# Patient Record
Sex: Male | Born: 1950 | Race: White | Hispanic: No | Marital: Married | Smoking: Former smoker
Health system: Southern US, Community
[De-identification: ages and names within clinical notes are randomized; demographics above are authoritative.]

## PROBLEM LIST (undated history)

## (undated) DIAGNOSIS — I10 Essential (primary) hypertension: Secondary | ICD-10-CM

## (undated) DIAGNOSIS — Z954 Presence of other heart-valve replacement: Secondary | ICD-10-CM

## (undated) DIAGNOSIS — I4891 Unspecified atrial fibrillation: Secondary | ICD-10-CM

## (undated) DIAGNOSIS — E7800 Pure hypercholesterolemia, unspecified: Secondary | ICD-10-CM

## (undated) DIAGNOSIS — M459 Ankylosing spondylitis of unspecified sites in spine: Secondary | ICD-10-CM

## (undated) DIAGNOSIS — J4489 Other specified chronic obstructive pulmonary disease: Secondary | ICD-10-CM

## (undated) DIAGNOSIS — I718 Aortic aneurysm of unspecified site, ruptured: Secondary | ICD-10-CM

## (undated) DIAGNOSIS — N289 Disorder of kidney and ureter, unspecified: Secondary | ICD-10-CM

## (undated) DIAGNOSIS — N029 Recurrent and persistent hematuria with unspecified morphologic changes: Secondary | ICD-10-CM

## (undated) DIAGNOSIS — I712 Thoracic aortic aneurysm, without rupture, unspecified: Secondary | ICD-10-CM

## (undated) DIAGNOSIS — N400 Enlarged prostate without lower urinary tract symptoms: Secondary | ICD-10-CM

## (undated) DIAGNOSIS — K589 Irritable bowel syndrome without diarrhea: Secondary | ICD-10-CM

## (undated) DIAGNOSIS — J449 Chronic obstructive pulmonary disease, unspecified: Secondary | ICD-10-CM

## (undated) HISTORY — DX: Benign prostatic hyperplasia without lower urinary tract symptoms: N40.0

## (undated) HISTORY — DX: Disorder of kidney and ureter, unspecified: N28.9

## (undated) HISTORY — DX: Essential (primary) hypertension: I10

## (undated) HISTORY — DX: Other specified chronic obstructive pulmonary disease: J44.89

## (undated) HISTORY — DX: Chronic obstructive pulmonary disease, unspecified: J44.9

## (undated) HISTORY — DX: Aortic aneurysm of unspecified site, ruptured: I71.8

## (undated) HISTORY — DX: Unspecified atrial fibrillation: I48.91

## (undated) HISTORY — DX: Presence of other heart-valve replacement: Z95.4

## (undated) HISTORY — DX: Pure hypercholesterolemia, unspecified: E78.00

## (undated) HISTORY — DX: Irritable bowel syndrome, unspecified: K58.9

## (undated) HISTORY — DX: Thoracic aortic aneurysm, without rupture, unspecified: I71.20

## (undated) HISTORY — DX: Recurrent and persistent hematuria with unspecified morphologic changes: N02.9

## (undated) HISTORY — DX: Ankylosing spondylitis of unspecified sites in spine: M45.9

## (undated) HISTORY — DX: Thoracic aortic aneurysm, without rupture: I71.2

---
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
## 1998-01-16 ENCOUNTER — Encounter: Payer: Self-pay | Admitting: Family Medicine

## 1998-01-16 ENCOUNTER — Ambulatory Visit (HOSPITAL_COMMUNITY): Admission: RE | Admit: 1998-01-16 | Discharge: 1998-01-16 | Payer: Self-pay | Admitting: Family Medicine

## 1998-03-04 DIAGNOSIS — I129 Hypertensive chronic kidney disease with stage 1 through stage 4 chronic kidney disease, or unspecified chronic kidney disease: Secondary | ICD-10-CM | POA: Insufficient documentation

## 1998-04-21 ENCOUNTER — Encounter: Payer: Self-pay | Admitting: Family Medicine

## 1998-04-21 ENCOUNTER — Ambulatory Visit (HOSPITAL_COMMUNITY): Admission: RE | Admit: 1998-04-21 | Discharge: 1998-04-21 | Payer: Self-pay | Admitting: Family Medicine

## 1999-10-16 ENCOUNTER — Ambulatory Visit (HOSPITAL_COMMUNITY): Admission: RE | Admit: 1999-10-16 | Discharge: 1999-10-16 | Payer: Self-pay | Admitting: Gastroenterology

## 2000-05-29 ENCOUNTER — Encounter: Payer: Self-pay | Admitting: Rheumatology

## 2000-05-29 ENCOUNTER — Encounter: Admission: RE | Admit: 2000-05-29 | Discharge: 2000-05-29 | Payer: Self-pay | Admitting: Rheumatology

## 2001-01-13 ENCOUNTER — Encounter: Admission: RE | Admit: 2001-01-13 | Discharge: 2001-01-13 | Payer: Self-pay | Admitting: Rheumatology

## 2001-01-13 ENCOUNTER — Encounter: Payer: Self-pay | Admitting: Rheumatology

## 2001-01-26 ENCOUNTER — Encounter: Payer: Self-pay | Admitting: Rheumatology

## 2001-01-26 ENCOUNTER — Encounter: Admission: RE | Admit: 2001-01-26 | Discharge: 2001-01-26 | Payer: Self-pay | Admitting: Rheumatology

## 2002-01-04 ENCOUNTER — Encounter (INDEPENDENT_AMBULATORY_CARE_PROVIDER_SITE_OTHER): Payer: Self-pay | Admitting: *Deleted

## 2002-01-04 ENCOUNTER — Ambulatory Visit (HOSPITAL_COMMUNITY): Admission: RE | Admit: 2002-01-04 | Discharge: 2002-01-04 | Payer: Self-pay | Admitting: Gastroenterology

## 2002-10-20 ENCOUNTER — Ambulatory Visit (HOSPITAL_COMMUNITY): Admission: RE | Admit: 2002-10-20 | Discharge: 2002-10-20 | Payer: Self-pay | Admitting: *Deleted

## 2002-10-20 ENCOUNTER — Encounter: Payer: Self-pay | Admitting: *Deleted

## 2003-02-03 ENCOUNTER — Ambulatory Visit (HOSPITAL_COMMUNITY): Admission: RE | Admit: 2003-02-03 | Discharge: 2003-02-03 | Payer: Self-pay | Admitting: Family Medicine

## 2003-04-11 ENCOUNTER — Ambulatory Visit (HOSPITAL_COMMUNITY): Admission: RE | Admit: 2003-04-11 | Discharge: 2003-04-11 | Payer: Self-pay | Admitting: Family Medicine

## 2003-11-21 ENCOUNTER — Ambulatory Visit: Admission: RE | Admit: 2003-11-21 | Discharge: 2003-11-21 | Payer: Self-pay | Admitting: Family Medicine

## 2004-10-06 ENCOUNTER — Emergency Department (HOSPITAL_COMMUNITY): Admission: EM | Admit: 2004-10-06 | Discharge: 2004-10-06 | Payer: Self-pay | Admitting: Emergency Medicine

## 2005-01-03 ENCOUNTER — Encounter: Admission: RE | Admit: 2005-01-03 | Discharge: 2005-01-03 | Payer: Self-pay | Admitting: Family Medicine

## 2005-02-08 ENCOUNTER — Encounter: Admission: RE | Admit: 2005-02-08 | Discharge: 2005-02-08 | Payer: Self-pay | Admitting: Family Medicine

## 2005-08-12 ENCOUNTER — Emergency Department (HOSPITAL_COMMUNITY): Admission: EM | Admit: 2005-08-12 | Discharge: 2005-08-12 | Payer: Self-pay | Admitting: Emergency Medicine

## 2005-08-19 ENCOUNTER — Encounter (HOSPITAL_COMMUNITY): Admission: RE | Admit: 2005-08-19 | Discharge: 2005-11-14 | Payer: Self-pay | Admitting: Emergency Medicine

## 2006-02-14 ENCOUNTER — Encounter: Admission: RE | Admit: 2006-02-14 | Discharge: 2006-02-14 | Payer: Self-pay | Admitting: Surgery

## 2007-03-13 ENCOUNTER — Encounter: Admission: RE | Admit: 2007-03-13 | Discharge: 2007-03-13 | Payer: Self-pay | Admitting: Surgery

## 2007-03-17 ENCOUNTER — Ambulatory Visit: Payer: Self-pay | Admitting: Surgery

## 2008-03-07 ENCOUNTER — Encounter: Admission: RE | Admit: 2008-03-07 | Discharge: 2008-03-07 | Payer: Self-pay | Admitting: Surgery

## 2008-03-08 ENCOUNTER — Ambulatory Visit: Payer: Self-pay | Admitting: Surgery

## 2009-03-04 DIAGNOSIS — Z954 Presence of other heart-valve replacement: Secondary | ICD-10-CM

## 2009-03-04 HISTORY — DX: Presence of other heart-valve replacement: Z95.4

## 2009-03-13 ENCOUNTER — Encounter: Admission: RE | Admit: 2009-03-13 | Discharge: 2009-03-13 | Payer: Self-pay | Admitting: Surgery

## 2009-03-14 ENCOUNTER — Ambulatory Visit: Payer: Self-pay | Admitting: Surgery

## 2009-04-25 ENCOUNTER — Ambulatory Visit: Payer: Self-pay | Admitting: Surgery

## 2009-09-21 HISTORY — PX: AORTIC VALVE REPLACEMENT: SHX41

## 2009-10-05 ENCOUNTER — Encounter (HOSPITAL_COMMUNITY): Admission: RE | Admit: 2009-10-05 | Discharge: 2009-12-01 | Payer: Self-pay | Admitting: Interventional Cardiology

## 2009-12-02 ENCOUNTER — Encounter (HOSPITAL_COMMUNITY): Admission: RE | Admit: 2009-12-02 | Discharge: 2010-01-15 | Payer: Self-pay | Admitting: Interventional Cardiology

## 2010-03-24 ENCOUNTER — Encounter: Payer: Self-pay | Admitting: Family Medicine

## 2010-07-17 NOTE — Assessment & Plan Note (Signed)
OFFICE VISIT   [name], [name]  DOB:  [birth year]                                        March 08, 2008  CHART #:  [id number]   The patient returns today for followup of his bicuspid aortic valve and  ascending aortic aneurysm.  I last saw him on March 17, 2007, at which  time his aortic aneurysm measured 4.4 x 4.4 cm.  He does have a bicuspid  aortic valve and an echocardiogram in June 2009 showed an aortic valve  area of 1.5-1.8 cm2 with no aortic insufficiency.  Over the past year,  he said he has been feeling fairly well overall, although his arthritis  is worsened some.  He continues to be physically active.  He has had no  shortness of breath with exertion or at rest.  He has had no dizziness  or syncope.  He has had no chest pain.   PHYSICAL EXAMINATION:  VITAL SIGNS:  Today, his blood pressure is  131/93, pulse 77 and regular, and respiratory rate is 18 and unlabored.  Oxygen saturation on room air is 97%.  He did not take his blood  pressure medication this morning.  GENERAL:  He looks well.  CARDIAC:  A regular rate and rhythm with a grade 3/6 systolic murmur  loudest over the aorta.  There is no diastolic murmur.  LUNGS:  Clear.   MEDICATIONS:  Lisinopril 5 mg daily, Lopressor 25 mg daily, Zocor daily,  and Enbrel 50 mg weekly.   A MR angiogram performed on March 07, 2008, showed no significant  change in the size of his ascending aortic aneurysm with a dimension of  4.3 x 4.5 cm.   IMPRESSION:  The patient has a 4.3 x 4.5-cm fusiform ascending aortic  aneurysm that is unchanged in size over the past year as well as a  bicuspid aortic valve with a history of mild-to-moderate aortic  stenosis.  He is doing well clinically.  I recommended continuing to  follow his aneurysm with a repeat MR angiogram in 1 year.  He has seen  Dr. Katrinka [name] in June and will have another echocardiogram done at that  time.  He currently has no clinical symptoms of  aortic stenosis and I  discussed that with him and asked him to contact Dr. Katrinka [name] if he  develops any other symptoms.  I will plan to see him back in 1 year.   Ryan [name], M.D.  Electronically Signed   BB/MEDQ  D:  03/08/2008  T:  03/08/2008  Job:  [id number]   cc:   Ryan [name], M.D.

## 2010-07-17 NOTE — Assessment & Plan Note (Signed)
OFFICE VISIT   Spayd, Kodiak E  DOB:  1950-10-18                                        March 14, 2009  CHART #:  [id number]   The patient returns today for followup of his bicuspid aortic valve  disease and ascending aortic aneurysm.  I last saw him 1 year ago on  March 08, 2008, at which time his MR angiogram showed that the maximum  diameter of his ascending aortic aneurysm was 4.3 x 4.5 cm.  This had  not changed in size.  The patient had moderate aortic stenosis at that  time.  He has been followed by Dr. Katrinka Blazing and said that his aortic  stenosis has been worsening.  He said that he was told by Dr. Katrinka Blazing that  his aortic valve area was 0.7 cm2 and it was probably time to proceed  with surgical repair.  He denies any chest pain or pressure.  He has had  some exertional dyspnea and fatigue.  He has not had any dizziness or  syncope.   On physical examination, his blood pressure is 138/93, pulse is 78 and  regular, and respiratory rate is 18 unlabored.  Oxygen saturation on  room air is 97%.  He looks well.  He has gained some weight since last  time I saw him.  Cardiac exam shows a regular rate and rhythm with a  grade 3/6 systolic murmur of aortic stenosis.  There is no diastolic  murmur.  His lungs are clear.  There is no peripheral edema.   An MR angiogram done March 13, 2009 shows there has been no  significant change in the size of his aneurysmal ascending aorta with a  maximum diameter measured at 4.3 cm.  There is taping to 3.4 cm in the  proximal arch and 2.3 cm in the distal arch.   IMPRESSION:  The patient has a stable 4.3-4.5 cm ascending and proximal  arch aneurysm with bicuspid aortic valve and worsening aortic stenosis.  I agree with Dr. Katrinka Blazing it is probably time to proceed with surgical  repair.  The patient said that he and his wife have made an appointment  at the [facility] for evaluation and probable surgery.  He has  requested our office records and is forwarding all that information to  the surgeon at the [facility].  I told him I would be happy to see  him again if the need arises to answer any questions.  Since he is  planning on  transferring his care to the [facility], I told him that he did  not need to return to see me unless some questions arise that I may help  him with.   Evelene Croon, M.D.  Electronically Signed   BB/MEDQ  D:  03/14/2009  T:  03/14/2009  Job:  [id number]   cc:   Lyn Records, M.D.

## 2010-07-17 NOTE — Assessment & Plan Note (Signed)
OFFICE VISIT   Castellana, Deryk E  DOB:  [birth year]                                        March 17, 2007  CHART #:  [id number]   Mr. Urwin returns today for follow up of his bicuspid aortic valve and  ascending aortic aneurysm.  I last saw him on 02/18/2006.  An MR  angiogram at that time had shown the maximum diameter of his ascending  aortic aneurysm was 4.1 cm.  Over the past two years he has been doing  well and has remained quite active despite his worsening ankylosing  spondylitis.  He said that he spent 6 weeks in [place] this year hiking  and camping and had no problem doing that.  He denies any chest pain or  pressure.  He has had no pains in his back.  He has had no shortness of  breath.  His energy level has been good.   PHYSICAL EXAMINATION:  His blood pressure is 113/78 and his pulse is 74  and regular.  Respiratory rate is 18 and unlabored.  Oxygen saturation  on room air is 97%.  He looks well.  Cardiac exam shows a regular rate  and rhythm with a grade 2/6 systolic murmur, loudest over the aorta.  His lungs are clear.   Follow up MR angiogram done 03/13/2007 shows the maximum diameter of his  ascending aortic aneurysm is slightly enlarged to 4.4 x 4.4 cm from the  previous 4.1 x 4.1 cm.  There is no other significant change to his  angiogram.   CURRENT MEDICATIONS:  Zocor 20 mg daily, Enbrel injections once a week  and Lopressor 25 mg once daily.  He had been on lisinopril 10 mg per day  but this was recently switched to Lopressor by his primary physician.   IMPRESSION:  Overall, Mr. Cantara is doing well.  The maximum diameter of  his ascending aortic aneurysm has increased by 3 mm over the past 2  years to 4.4 x 4.4 cm.  I recommended continuing to follow this for now  and repeating his MR angiogram in one year.  If there is progressive  enlargement over that period of time, then we would have to consider  proceeding  with surgical repair.  He  does have an appointment to see Dr. Katrinka Blazing  within the next 2 months for follow up of his bicuspid aortic valve.  He  appears to have good blood pressure control.   Evelene Croon, M.D.  Electronically Signed   BB/MEDQ  D:  03/17/2007  T:  03/17/2007  Job:  [id number]   cc:   Lyn Records, M.D.  Carola J. Gerri Spore, M.D.

## 2010-07-17 NOTE — Assessment & Plan Note (Signed)
OFFICE VISIT   Ryan [name], Ryan [name]  DOB:  08-28-1950                                        April 26, 2009  CHART #:  [id number]   The patient returned to my office today for further discussion of his  bicuspid aortic valve disease and ascending aortic aneurysm.  He has a  4.3-cm ascending aortic aneurysm with a bicuspid aortic valve that we  have been following.  His aortic valve stenosis has worsened and  by his  report, echocardiogram shows that the valve area is 0.7 cm2.  The  echocardiogram was done in Dr. Michaelle Copas office, so I was not able to  review that.  I have not received an official report of that  echocardiogram.  He was told by Dr. Katrinka Blazing that he needed to have his  aortic valve replaced and when I saw him on March 14, 2009, I  discussed my recommendation for replacing his ascending aortic aneurysm  along with replacement of the aortic valve using a valve conduit.  He  said that he have made arrangements to be evaluated at the [facility] and to have a surgery there since his wife and other family  members had been pushing him in that direction.  I told him that I would  be happy to see him back if he had any further questions.  He returns  today and said that he has been in contact with [facility], but  has not been receiving as much information as he would like.  He was  told that he would need to go out from the [facility] and have a  cardiac catheterization and further studies before being evaluated by a  surgeon.  He was also concerned because he was told by the clinical  nurse that he would probably have just an aortic valve replacement and  not replacement of his ascending aortic aneurysm since it was not large  enough yet.  This concerned him and therefore, he returned today to  discuss it further with me.  I reviewed his MR angiogram findings again  with him and my recommendation that if he has surgery that he have  replacement of his valve and ascending aortic aneurysm.  This would  probably require replacement up to the proximal aortic arch using deep  hypothermic circulatory arrest.  I discussed the options for using a  mechanical valve conduit or a tissue valve conduit with a pericardial  prosthesis.  I discussed the benefits and risks of both.  He said that  he would probably rather have a tissue valve since he is not thrilled  about being on Coumadin and is also planning on traveling extensively,  and thinks he will have problems with Coumadin compliance and followup.  He also has a history of significant ankylosing spondylitis that has  been worsening, and I think would be at increased risk for use of  Coumadin.  I discussed the surgery in detail with him and answered all  of his questions.  He said that he is probably planning on going out to  the [facility] to be evaluated and that will make a decision about  where he would like to have surgery and will contact me if the need  arises.   Evelene Croon, M.D.  Electronically Signed  BB/MEDQ  D:  04/26/2009  T:  04/27/2009  Job:  [id number]   cc:   Lyn Records, M.D.

## 2010-07-20 NOTE — Op Note (Signed)
   NAME:  Ryan [name], Ryan [name]                            ACCOUNT NO.:  [id number]   MEDICAL RECORD NO.:  [id number]                   PATIENT TYPE:  AMB   LOCATION:  ENDO                                 FACILITY:  [facility]   PHYSICIAN:  James L. Malon Kindle., M.D.          DATE OF BIRTH:  10-14-1950   DATE OF PROCEDURE:  01/04/2002  DATE OF DISCHARGE:                                 OPERATIVE REPORT   PROCEDURE:  Colonoscopy and biopsy.   MEDICATIONS:  Fentanyl 100 mcg, Versed 5 mg IV.   ENDOSCOPE:  Adult Olympus video colonoscope.   INDICATIONS:  A nice young man with known ankylosing spondylitis who has had  some rectal bleeding.  He has had no family history of colon cancer.  He has  never been evaluated for inflammatory bowel disease.  Given his rectal  bleeding and history of ankylosing spondylitis, a colonoscopy is performed  to evaluate for inflammatory bowel disease.   DESCRIPTION OF PROCEDURE:  The procedure had been explained to the patient  and consent obtained.  With the patient in the left lateral decubitus  position, a digital exam was performed and the scope was inserted and  advanced.  We reached the cecum easily without difficulty.  The ileocecal  valve and appendiceal orifice were seen.  The  ileocecal valve attempted to  be entered, but after some time it could not be.  After some time we came  back and took random colon biopsies throughout.  The mucosa was completely  normal throughout the entire colon.  There was no evidence of colitis  throughout, no diverticular disease, no polyps.  The scope was withdrawn.  The patient tolerated the procedure well, was maintained on low-flow oxygen  and pulse oximetry throughout the procedure.   IMPRESSION:  1. Rectal bleeding probably due to internal hemorrhoids.  A large internal     hemorrhoid was seen on the retroflexed     view.  2. No obvious colitis.   PLAN:  Will check pathology for microscopic colitis and see back in  the  office in three months.                                                James L. Malon Kindle., M.D.    Waldron Session  D:  01/04/2002  T:  01/04/2002  Job:  [id number]   cc:   Aundra Dubin, M.D.  [address]  [place]  [place] [address]  Fax: 1   Tama Headings. Marina Goodell, M.D.

## 2010-07-20 NOTE — Procedures (Signed)
[facility]  Patient:    Ryan [name], Ryan [name]                    MRN: [id number] Proc. Date: 10/16/99 Adm. Date:  [id number] Attending:  Orland Mustard CC:         Willis Modena. Dreiling, M.D.  Demetria Pore. Coral Spikes, M.D.   Procedure Report  PROCEDURE:  Esophagogastroduodenoscopy.  MEDICATIONS:  Hurricane spray, fentanyl 75 mcg and Versed 6 mg IV.  INDICATIONS FOR PROCEDURE:  A nice 60 year old gentleman with diffuse ankylosis spondylitis followed by Dr. Coral Spikes with dysphagia. These symptoms have been somewhat peculiar in that he has trouble initiating the swallow as well as sensation of food catching in his lower esophagus when he swallows as well but has never had an obstructed esophagus. Barium swallow showed some delay in passage of the tablet but no obvious stricture. In fact, the table shot through the distal esophagus. This is done to evaluate this area endoscopically. The patient has empirically been started on Nexium and notes that he is in essence asymptomatic while on the Nexium. Overall, he feels much much better.  DESCRIPTION OF PROCEDURE:  The procedure had been explained to the patient and consent obtained. With the patient in the left lateral decubitus position, the Olympus video endoscope was inserted blindly into the esophagus and advanced under direct vision. The esophagus was endoscopically normal. We reached the distal esophagus and there was a small hiatal hernia. The gastroesophageal junction was widely patent. The scope was passed in the stomach, the pylorus identified and passed. The duodenum including the bulb and second portion were unremarkable. The scope was withdrawn back in the antrum, it was normal. The fundus and cardia were seen in the retroflexed view and were normal. There was a hiatal hernia. The distal and proximal esophagus was widely patent, no stricture. The scope was withdrawn and the esophagus carefully examined and  no abnormalities seen. The oropharynx was seen briefly and removed and was grossly normal. The patient tolerated the procedure well maintained on low flow oxygen and pulse oximeter throughout the procedure with no obvious problem.  ASSESSMENT:  1. Hiatal hernia with a widely patent gastroesophageal junction. No obvious     obstruction to swallowing. I think he clearly has  gastroesophageal     reflux disease.  2. No obvious mechanical obstruction to the upper esophagus.  PLAN:  Will continue on the Nexium, follow-up in the office in 2-3 months. DD:  10/16/99 TD:  10/16/99 Job: [id number]

## 2011-09-18 ENCOUNTER — Other Ambulatory Visit: Payer: Self-pay | Admitting: Rheumatology

## 2011-09-18 DIAGNOSIS — M545 Low back pain, unspecified: Secondary | ICD-10-CM

## 2011-09-21 ENCOUNTER — Ambulatory Visit
Admission: RE | Admit: 2011-09-21 | Discharge: 2011-09-21 | Disposition: A | Discharge status: Home / Self Care | Payer: BC Managed Care – PPO | Source: Ambulatory Visit | Attending: Rheumatology | Admitting: Rheumatology

## 2011-09-21 DIAGNOSIS — M545 Low back pain, unspecified: Secondary | ICD-10-CM

## 2011-10-04 ENCOUNTER — Other Ambulatory Visit: Payer: Self-pay | Admitting: Rheumatology

## 2011-10-04 DIAGNOSIS — M4800 Spinal stenosis, site unspecified: Secondary | ICD-10-CM

## 2011-10-09 ENCOUNTER — Ambulatory Visit
Admission: RE | Admit: 2011-10-09 | Discharge: 2011-10-09 | Disposition: A | Discharge status: Home / Self Care | Payer: BC Managed Care – PPO | Source: Ambulatory Visit | Attending: Rheumatology | Admitting: Rheumatology

## 2011-10-09 VITALS — BP 134/80 | HR 63

## 2011-10-09 DIAGNOSIS — M4800 Spinal stenosis, site unspecified: Secondary | ICD-10-CM

## 2011-10-09 MED ORDER — METHYLPREDNISOLONE ACETATE 40 MG/ML INJ SUSP (RADIOLOG
120.0000 mg | Freq: Once | INTRAMUSCULAR | Status: AC
  Administered 2011-10-09: 120 mg via EPIDURAL

## 2011-10-09 MED ORDER — IOHEXOL 180 MG/ML  SOLN
1.0000 mL | Freq: Once | INTRAMUSCULAR | Status: AC | PRN
  Administered 2011-10-09: 1 mL via EPIDURAL

## 2011-10-10 ENCOUNTER — Other Ambulatory Visit: Payer: BC Managed Care – PPO

## 2012-01-21 ENCOUNTER — Ambulatory Visit
Admission: RE | Admit: 2012-01-21 | Discharge: 2012-01-21 | Disposition: A | Discharge status: Home / Self Care | Payer: BC Managed Care – PPO | Source: Ambulatory Visit | Attending: Rheumatology | Admitting: Rheumatology

## 2012-01-21 ENCOUNTER — Other Ambulatory Visit: Payer: Self-pay | Admitting: Rheumatology

## 2012-01-21 DIAGNOSIS — R52 Pain, unspecified: Secondary | ICD-10-CM

## 2012-12-08 ENCOUNTER — Telehealth: Payer: Self-pay | Admitting: Interventional Cardiology

## 2012-12-08 NOTE — Telephone Encounter (Signed)
returned pt call regarding bp.pt med list has changed pt will call back with med and dosage. Dr.Smith will then give instructions.

## 2012-12-08 NOTE — Telephone Encounter (Signed)
New Problem:  Pt states he has concerns about his BP. Pt states his BP this morning was 140/100 and yesterday it was 139/90. Please advise

## 2012-12-08 NOTE — Telephone Encounter (Signed)
New Problem  Primary care changed his BP medication//New drug that the patient is taking Losartan 25 mg/// Primary care changed his BP medication. Request call back

## 2012-12-09 NOTE — Telephone Encounter (Signed)
lmom with Dr. Michaelle Copas instructions.pt is to increase losartan to 50mg  daliy and monitor his bp at home

## 2012-12-17 ENCOUNTER — Telehealth: Payer: Self-pay | Admitting: Interventional Cardiology

## 2012-12-17 DIAGNOSIS — I10 Essential (primary) hypertension: Secondary | ICD-10-CM

## 2012-12-17 MED ORDER — LOSARTAN POTASSIUM 50 MG PO TABS: 50.0000 mg | ORAL_TABLET | Freq: Every day | ORAL | Status: DC

## 2012-12-17 NOTE — Telephone Encounter (Signed)
Patient was told to double up on BP meds ( Losatan). This has worked very well for him. He will need a RF called in and would like that called into [facility] [address]

## 2012-12-17 NOTE — Telephone Encounter (Signed)
done

## 2013-01-04 ENCOUNTER — Encounter: Payer: Self-pay | Admitting: Interventional Cardiology

## 2013-02-03 ENCOUNTER — Other Ambulatory Visit: Payer: Self-pay | Admitting: Rheumatology

## 2013-02-03 DIAGNOSIS — IMO0002 Reserved for concepts with insufficient information to code with codable children: Secondary | ICD-10-CM

## 2013-02-03 DIAGNOSIS — IMO0001 Reserved for inherently not codable concepts without codable children: Secondary | ICD-10-CM

## 2013-02-18 ENCOUNTER — Telehealth: Payer: Self-pay | Admitting: Interventional Cardiology

## 2013-02-18 DIAGNOSIS — Z954 Presence of other heart-valve replacement: Secondary | ICD-10-CM

## 2013-02-18 NOTE — Telephone Encounter (Signed)
April 2015 

## 2013-02-18 NOTE — Telephone Encounter (Signed)
New message    Pt usually gets an echo every year.  He is due to see Dr Katrinka Blazing in Jan but no echo is scheduled.  He is going to be out of town the week after jan 16 for 8wks. Should he have an echo?

## 2013-03-03 ENCOUNTER — Other Ambulatory Visit: Payer: BC Managed Care – PPO

## 2013-03-05 ENCOUNTER — Ambulatory Visit
Admission: RE | Admit: 2013-03-05 | Discharge: 2013-03-05 | Disposition: A | Discharge status: Home / Self Care | Payer: Self-pay | Source: Ambulatory Visit | Attending: Rheumatology | Admitting: Rheumatology

## 2013-03-05 DIAGNOSIS — IMO0002 Reserved for concepts with insufficient information to code with codable children: Secondary | ICD-10-CM

## 2013-03-05 DIAGNOSIS — IMO0001 Reserved for inherently not codable concepts without codable children: Secondary | ICD-10-CM

## 2013-03-05 DIAGNOSIS — M4850XS Collapsed vertebra, not elsewhere classified, site unspecified, sequela of fracture: Secondary | ICD-10-CM

## 2013-03-17 ENCOUNTER — Ambulatory Visit (HOSPITAL_COMMUNITY): Payer: BC Managed Care – PPO | Attending: Interventional Cardiology | Admitting: Radiology

## 2013-03-17 ENCOUNTER — Encounter: Payer: Self-pay | Admitting: Cardiology

## 2013-03-17 DIAGNOSIS — I079 Rheumatic tricuspid valve disease, unspecified: Secondary | ICD-10-CM | POA: Insufficient documentation

## 2013-03-17 DIAGNOSIS — I359 Nonrheumatic aortic valve disorder, unspecified: Secondary | ICD-10-CM | POA: Insufficient documentation

## 2013-03-17 DIAGNOSIS — Z954 Presence of other heart-valve replacement: Secondary | ICD-10-CM | POA: Insufficient documentation

## 2013-03-17 NOTE — Progress Notes (Signed)
Echocardiogram performed.  

## 2013-03-19 ENCOUNTER — Telehealth: Payer: Self-pay

## 2013-03-19 ENCOUNTER — Ambulatory Visit: Payer: BC Managed Care – PPO | Admitting: Interventional Cardiology

## 2013-03-19 NOTE — Telephone Encounter (Signed)
Message copied by Lamar Laundry on Fri Mar 19, 2013  2:51 PM ------      Message from: Daneen Schick      Created: Thu Mar 18, 2013  7:45 AM       The echo shows valve and heart function are stable. ------

## 2013-03-19 NOTE — Telephone Encounter (Signed)
pt given results of echo.The echo shows valve and heart function are stable.pt verbalized understanding.

## 2013-05-20 ENCOUNTER — Ambulatory Visit (INDEPENDENT_AMBULATORY_CARE_PROVIDER_SITE_OTHER): Payer: BC Managed Care – PPO | Admitting: Interventional Cardiology

## 2013-05-20 ENCOUNTER — Encounter: Payer: Self-pay | Admitting: Interventional Cardiology

## 2013-05-20 VITALS — BP 123/77 | HR 78 | Ht 62.0 in | Wt 154.0 lb

## 2013-05-20 DIAGNOSIS — I10 Essential (primary) hypertension: Secondary | ICD-10-CM

## 2013-05-20 DIAGNOSIS — Z954 Presence of other heart-valve replacement: Secondary | ICD-10-CM

## 2013-05-20 DIAGNOSIS — I7121 Aneurysm of the ascending aorta, without rupture: Secondary | ICD-10-CM | POA: Insufficient documentation

## 2013-05-20 DIAGNOSIS — J449 Chronic obstructive pulmonary disease, unspecified: Secondary | ICD-10-CM

## 2013-05-20 DIAGNOSIS — I718 Aortic aneurysm of unspecified site, ruptured: Secondary | ICD-10-CM

## 2013-05-20 DIAGNOSIS — E7800 Pure hypercholesterolemia, unspecified: Secondary | ICD-10-CM | POA: Insufficient documentation

## 2013-05-20 DIAGNOSIS — I712 Thoracic aortic aneurysm, without rupture: Secondary | ICD-10-CM | POA: Insufficient documentation

## 2013-05-20 DIAGNOSIS — Z952 Presence of prosthetic heart valve: Secondary | ICD-10-CM | POA: Insufficient documentation

## 2013-05-20 NOTE — Patient Instructions (Signed)
Your physician recommends that you continue on your current medications as directed. Please refer to the Current Medication list given to you today.  Your physician wants you to follow-up in: 1 year. You will receive a reminder letter in the mail two months in advance. If you don't receive a letter, please call our office to schedule the follow-up appointment.  

## 2013-05-20 NOTE — Progress Notes (Signed)
Patient ID: Ryan [name], male   DOB: [birth year], 63 y.o.   MRN: [id number]    [address], [place]  [address] Phone: [phone] Fax:  [phone]  Date:  05/20/2013   ID:  Ryan [name], DOB [birth year], MRN [id number]  PCP:  No primary provider on file.   ASSESSMENT:  1. Status post aortic valve replacement with bioprosthesis 2011. No evidence of valvular dysfunction by clinical exam. Mild aortic regurgitation by echo 2. Hypertension 3. Aortic root enlargement 4. Thoracic aortic aneurysm treated with aortoplasty 2011 5. History of postoperative atrial fibrillation 6. Hyperlipidemia   PLAN:  1. Weight loss 2. Low salt diet 3. Cut back on alcohol intake 4. Clinical followup in one year   SUBJECTIVE: Ryan [name] is a 63 y.o. male who is status post aortic valve replacement with a bowel prosthesis in 2011. He has had no acute or limiting cardiovascular complaints. He denies chest pain. No orthopnea PND. No lower extremity edema. He has not had palpitations or syncope. Is no edema.   Wt Readings from Last 3 Encounters:  05/20/13 154 lb (69.854 kg)     Past Medical History  Diagnosis Date  . 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 Mild [name] is followed clinically and by exam over the past 2-3 years.  . Essential hypertension, benign     controlled  . Aortic aneurysm of unspecified site, ruptured     last aortic root diameter was 3.9 cm  . Chronic airway obstruction, not elsewhere classified     DOE possibly related to this problem and some restriction from ankylosing spondylitis  . Pure hypercholesterolemia   . Thoracic aortic aneurysm     treated with aortoplasty at the time of aortic valve replacement, [facility], 09/2009  . Atrial fibrillation     Post-op A fib  . BPH (benign prostatic hyperplasia)   . Benign hematuria   . Ankylosing spondylitis   . Renal insufficiency   .  Hypertension   . IBS (irritable bowel syndrome)     Current Outpatient Prescriptions  Medication Sig Dispense Refill  . albuterol (PROVENTIL HFA;VENTOLIN HFA) 108 (90 BASE) MCG/ACT inhaler Inhale 2 puffs into the lungs every 4 (four) hours as needed for wheezing or shortness of breath.      . brimonidine (ALPHAGAN P) 0.1 % SOLN 1 drop daily.      . calcium carbonate (OS-CAL) 600 MG TABS tablet Take 600 mg by mouth daily.      . clotrimazole (LOTRIMIN) 1 % cream Apply 1 application topically 2 (two) times daily.      . cyclobenzaprine (FLEXERIL) 10 MG tablet Take 10 mg by mouth at bedtime as needed for muscle spasms.      [name] Kitchen etanercept (ENBREL) 50 MG/ML injection Inject 50 mg into the skin once a week.      . losartan (COZAAR) 25 MG tablet Take 25 mg by mouth daily.      . metoprolol succinate (TOPROL-XL) 50 MG 24 hr tablet Take 50 mg by mouth daily. Take with or immediately following a meal.      . Multiple Vitamin (MULTIVITAMIN) tablet Take 1 tablet by mouth daily.      [name] Kitchen PREDNISONE PO Take 10 mg by mouth as needed.      . sildenafil (VIAGRA) 50 MG tablet Take 50 mg by mouth as needed for erectile dysfunction.      [name] Kitchen  simvastatin (ZOCOR) 20 MG tablet Take 20 mg by mouth daily.      [name] Kitchen Spacer/Aero-Holding Chambers (AEROCHAMBER PLUS) inhaler Use as instructed      . vitamin B-12 (CYANOCOBALAMIN) 1000 MCG tablet Take 1,000 mcg by mouth daily.       No current facility-administered medications for this visit.    Allergies:   No Known Allergies  Social History:  The patient  reports that he quit smoking about 5 years ago. His smoking use included Cigarettes. He smoked 0.00 packs per day for 10 years. He does not have any smokeless tobacco history on file. He reports that he drinks about 4.2 ounces of alcohol per week. He reports that he does not use illicit drugs.   ROS:  Please see the history of present illness.   mild exertional dyspnea, weight loss, not exercising on a regular basis, no  chest pain   All other systems reviewed and negative.   OBJECTIVE: VS:  BP 123/77  Pulse 78  Ht 5\' 2"  (1.575 m)  Wt 154 lb (69.854 kg)  BMI 28.16 kg/m2 Well nourished, well developed, in no acute distress, obvious postural deformity due to ankylosing spondylitis HEENT: normal Neck: JVD absent. Carotid bruit absent  Cardiac:  normal S1, S2; RRR; no murmur Lungs:  clear to auscultation bilaterally, no wheezing, rhonchi or rales Abd: soft, nontender, no hepatomegaly Ext: Edema absent. Pulses 2+ bilateral Skin: warm and dry Neuro:  CNs 2-12 intact, no focal abnormalities noted  EKG:  Normal sinus rhythm, biatrial abnormality, small inferior Q waves, otherwise normal     Past Medical History  Thoracic aortic aneurysm treated with aortoplasty at the time of aortic valve replacement, [facility] 09/2009   Aortic valve replacement with bioprosthesis, [facility] July 2011   Postoperative atrial fibrillation, July 2011   benign hematuria; BPH (Dr. Terance Hart)   Ankylosing spondylitis   Renal insufficiency   Hypertension   IBS    ECHOCARDIOGRAM 2014 1. There is mild dilatation of the ascending aorta. 2. There is a bioprosthetic aortic valve. 3. There is mild to moderate prosthetic valvular regurgitation. No significant interval change 4. Left ventricular ejection fraction estimated by 2D at 60-65 percent. Signed, Illene Labrador III, MD 05/20/2013 6:45 PM

## 2013-07-08 ENCOUNTER — Other Ambulatory Visit: Payer: Self-pay

## 2013-07-08 MED ORDER — LOSARTAN POTASSIUM 25 MG PO TABS: 25.0000 mg | ORAL_TABLET | Freq: Every day | ORAL | Status: DC

## 2013-08-12 ENCOUNTER — Ambulatory Visit
Admission: RE | Admit: 2013-08-12 | Discharge: 2013-08-12 | Disposition: A | Discharge status: Home / Self Care | Payer: BC Managed Care – PPO | Source: Ambulatory Visit | Attending: Family Medicine | Admitting: Family Medicine

## 2013-08-12 ENCOUNTER — Other Ambulatory Visit: Payer: Self-pay | Admitting: Family Medicine

## 2013-08-12 DIAGNOSIS — J449 Chronic obstructive pulmonary disease, unspecified: Secondary | ICD-10-CM

## 2013-08-16 ENCOUNTER — Other Ambulatory Visit: Payer: Self-pay | Admitting: Family Medicine

## 2013-08-16 DIAGNOSIS — R222 Localized swelling, mass and lump, trunk: Secondary | ICD-10-CM

## 2013-08-18 ENCOUNTER — Ambulatory Visit
Admission: RE | Admit: 2013-08-18 | Discharge: 2013-08-18 | Disposition: A | Discharge status: Home / Self Care | Payer: BC Managed Care – PPO | Source: Ambulatory Visit | Attending: Family Medicine | Admitting: Family Medicine

## 2013-08-18 DIAGNOSIS — R222 Localized swelling, mass and lump, trunk: Secondary | ICD-10-CM

## 2014-01-17 ENCOUNTER — Other Ambulatory Visit: Payer: Self-pay | Admitting: *Deleted

## 2014-01-17 MED ORDER — LOSARTAN POTASSIUM 25 MG PO TABS: 25.0000 mg | ORAL_TABLET | Freq: Every day | ORAL | Status: DC

## 2014-05-23 ENCOUNTER — Encounter: Payer: Self-pay | Admitting: Interventional Cardiology

## 2014-05-23 ENCOUNTER — Ambulatory Visit (INDEPENDENT_AMBULATORY_CARE_PROVIDER_SITE_OTHER): Payer: BLUE CROSS/BLUE SHIELD | Admitting: Interventional Cardiology

## 2014-05-23 VITALS — BP 126/84 | HR 58 | Ht 62.0 in | Wt 153.2 lb

## 2014-05-23 DIAGNOSIS — E7800 Pure hypercholesterolemia, unspecified: Secondary | ICD-10-CM

## 2014-05-23 DIAGNOSIS — I10 Essential (primary) hypertension: Secondary | ICD-10-CM

## 2014-05-23 DIAGNOSIS — Z954 Presence of other heart-valve replacement: Secondary | ICD-10-CM

## 2014-05-23 DIAGNOSIS — I718 Aortic aneurysm of unspecified site, ruptured: Secondary | ICD-10-CM

## 2014-05-23 DIAGNOSIS — Z952 Presence of prosthetic heart valve: Secondary | ICD-10-CM

## 2014-05-23 NOTE — Progress Notes (Signed)
Cardiology Office Note   Date:  05/23/2014   ID:  Ryan [name], DOB 01/06/1951, MRN [id number]  PCP:  Ryan Bellows, MD  Cardiologist:   Ryan Grooms, MD   No chief complaint on file.     History of Present Illness: Ryan [name] is a 64 y.o. male who presents for AVR and Diastolic HF. He denies chest discomfort consistent with angina. Is no peripheral edema. He has not had syncope. He takes medications as prescribed and listed below. Overall, there is a little more shortness of breath now than previously. He exercises regularly. He denies orthopnea.    Past Medical History  Diagnosis Date  . 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 Mild Ryan [name] is followed clinically and by exam over the past 2-3 years.  . Essential hypertension, benign     controlled  . Aortic aneurysm of unspecified site, ruptured     last aortic root diameter was 3.9 cm  . Chronic airway obstruction, not elsewhere classified     DOE possibly related to this problem and some restriction from ankylosing spondylitis  . Pure hypercholesterolemia   . Thoracic aortic aneurysm     treated with aortoplasty at the time of aortic valve replacement, [facility], 09/2009  . Atrial fibrillation     Post-op A fib  . BPH (benign prostatic hyperplasia)   . Benign hematuria   . Ankylosing spondylitis   . Renal insufficiency   . Hypertension   . IBS (irritable bowel syndrome)     Past Surgical History  Procedure Laterality Date  . Aortic valve replacement  09/21/09    With bioprosthesis at the [facility]     Current Outpatient Prescriptions  Medication Sig Dispense Refill  . albuterol (PROVENTIL HFA;VENTOLIN HFA) 108 (90 BASE) MCG/ACT inhaler Inhale 2 puffs into the lungs every 4 (four) hours as needed for wheezing or shortness of breath.    . brimonidine (ALPHAGAN P) 0.1 % SOLN 1 drop daily.    . calcium carbonate (OS-CAL) 600 MG TABS tablet Take  600 mg by mouth daily.    . clotrimazole (LOTRIMIN) 1 % cream Apply 1 application topically 2 (two) times daily.    . cyclobenzaprine (FLEXERIL) 10 MG tablet Take 10 mg by mouth at bedtime as needed for muscle spasms.    [name] Kitchen etanercept (ENBREL) 50 MG/ML injection Inject 50 mg into the skin once a week.    . losartan (COZAAR) 25 MG tablet Take 1 tablet (25 mg total) by mouth daily. 30 tablet 3  . LUMIGAN 0.01 % SOLN Place 1 drop into both eyes daily.     . metoprolol succinate (TOPROL-XL) 50 MG 24 hr tablet Take 50 mg by mouth daily. Take with or immediately following a meal.    . Multiple Vitamin (MULTIVITAMIN) tablet Take 1 tablet by mouth daily.    . sildenafil (VIAGRA) 50 MG tablet Take 50 mg by mouth as needed for erectile dysfunction.    . simvastatin (ZOCOR) 20 MG tablet Take 20 mg by mouth daily.    [name] Kitchen Spacer/Aero-Holding Chambers (AEROCHAMBER PLUS) inhaler Use as instructed    . vitamin B-12 (CYANOCOBALAMIN) 1000 MCG tablet Take 1,000 mcg by mouth daily.     No current facility-administered medications for this visit.    Allergies:   Review of patient's allergies indicates no known allergies.    Social History:  The patient  reports that he quit  smoking about 6 years ago. His smoking use included Cigarettes. He quit after 10 years of use. He does not have any smokeless tobacco history on file. He reports that he drinks about 4.2 oz of alcohol per week. He reports that he does not use illicit drugs.   Family History:  The patient's family history includes Cancer in his mother; Cirrhosis in his father; Congestive Heart Failure in his father; Emphysema in his mother; Macular degeneration in his mother; Parkinson's disease in his mother.    ROS:  Please see the history of present illness.   Otherwise, review of systems are positive for mild weight gain..   All other systems are reviewed and negative.    PHYSICAL EXAM: VS:  BP 126/84 mmHg  Pulse 58  Ht 5\' 2"  (1.575 m)  Wt 153 lb 3.2 oz  (69.491 kg)  BMI 28.01 kg/m2 , BMI Body mass index is 28.01 kg/(m^2). GEN: Well nourished, well developed, in no acute distress HEENT: normal Neck: no JVD, carotid bruits, or masses Cardiac: 2 to 3/6 systolic crescendo decrescendo murmur left midsternal border and right upper sternal border. No diastolic murmur. RRR; no rubs, or gallops,no edema . Respiratory:  clear to auscultation bilaterally, normal work of breathing GI: soft, nontender, nondistended, + BS MS: no deformity or atrophy. Diffuse neck thoracic and lumbar spine related to ankylosing spondylitis. Skin: warm and dry, no rash Neuro:  Strength and sensation are intact Psych: euthymic mood, full affect    EKG:  EKG is ordered today. The ekg ordered today NSR with with increased voltage.   Recent Labs: No results found for requested labs within last 365 days.    Lipid Panel No results found for: CHOL, TRIG, HDL, CHOLHDL, VLDL, LDLCALC, LDLDIRECT    Wt Readings from Last 3 Encounters:  05/23/14 153 lb 3.2 oz (69.491 kg)  05/20/13 154 lb (69.854 kg)      Other studies Reviewed: Additional studies/ records that were reviewed today include: .    ASSESSMENT AND PLAN:  Essential hypertension, benign - in trolled  S/P AVR (aortic valve replacement) - by auscultation there is no evidence of valve dysfunction. He is a systolic murmur which is been present since surgery.  Aortic aneurysm, ruptured: status post repair.  Pure hypercholesterolemia: Currently taken simvastatin     Current medicines are reviewed at length with the patient today.  The patient does not have concerns regarding medicines.  The following changes have been made:  no change  Labs/ tests ordered today include:   Orders Placed This Encounter  Procedures  . EKG 12-Lead     Disposition:   FU with Ryan [name] in 1 Year   Signed, Ryan Grooms, MD  05/23/2014 10:18 AM    [address],  [place], [place]  [address] Phone: [phone]; Fax: [phone]

## 2014-05-23 NOTE — Patient Instructions (Signed)
Your physician recommends that you continue on your current medications as directed. Please refer to the Current Medication list given to you today.  Your physician wants you to follow-up in: 18 months with Dr.Smith You will receive a reminder letter in the mail two months in advance. If you don't receive a letter, please call our office to schedule the follow-up appointment.

## 2015-08-08 ENCOUNTER — Telehealth: Payer: Self-pay | Admitting: Interventional Cardiology

## 2015-08-08 DIAGNOSIS — I359 Nonrheumatic aortic valve disorder, unspecified: Secondary | ICD-10-CM

## 2015-08-08 NOTE — Telephone Encounter (Signed)
New message   Pt calling to see if he need an echo scheduled before his 15 month check up in September with dr.smith

## 2015-08-08 NOTE — Telephone Encounter (Signed)
Will forward to Dr. Tamala Julian for review and advisement on if he would like an echo for pt prior to follow up appt.

## 2015-08-12 NOTE — Telephone Encounter (Signed)
Please schedule a 2 D doppler echo prior to next OV for f/u aortic valve disease.

## 2015-08-14 NOTE — Telephone Encounter (Signed)
Spoke with pt and informed him Dr. Tamala Julian said ok to get echo. Scheduled pt to see Dr. Tamala Julian in Sept and advised him that someone from our office would call him to get his echo scheduled. Pt verbalized understanding and was appreciative for call.

## 2015-11-13 ENCOUNTER — Ambulatory Visit (HOSPITAL_COMMUNITY): Payer: Medicare Other | Attending: Internal Medicine

## 2015-11-13 ENCOUNTER — Other Ambulatory Visit: Payer: Self-pay

## 2015-11-13 DIAGNOSIS — I4891 Unspecified atrial fibrillation: Secondary | ICD-10-CM | POA: Diagnosis not present

## 2015-11-13 DIAGNOSIS — I351 Nonrheumatic aortic (valve) insufficiency: Secondary | ICD-10-CM | POA: Diagnosis not present

## 2015-11-13 DIAGNOSIS — I719 Aortic aneurysm of unspecified site, without rupture: Secondary | ICD-10-CM | POA: Insufficient documentation

## 2015-11-13 DIAGNOSIS — I119 Hypertensive heart disease without heart failure: Secondary | ICD-10-CM | POA: Diagnosis not present

## 2015-11-13 DIAGNOSIS — I359 Nonrheumatic aortic valve disorder, unspecified: Secondary | ICD-10-CM | POA: Diagnosis present

## 2015-11-13 DIAGNOSIS — Z952 Presence of prosthetic heart valve: Secondary | ICD-10-CM | POA: Diagnosis not present

## 2015-11-13 DIAGNOSIS — I340 Nonrheumatic mitral (valve) insufficiency: Secondary | ICD-10-CM | POA: Insufficient documentation

## 2015-11-13 DIAGNOSIS — E7800 Pure hypercholesterolemia, unspecified: Secondary | ICD-10-CM | POA: Insufficient documentation

## 2015-11-21 ENCOUNTER — Encounter (INDEPENDENT_AMBULATORY_CARE_PROVIDER_SITE_OTHER): Payer: Self-pay

## 2015-11-21 ENCOUNTER — Ambulatory Visit (INDEPENDENT_AMBULATORY_CARE_PROVIDER_SITE_OTHER): Payer: Medicare Other | Admitting: Interventional Cardiology

## 2015-11-21 ENCOUNTER — Encounter: Payer: Self-pay | Admitting: Interventional Cardiology

## 2015-11-21 VITALS — BP 120/70 | HR 60 | Ht 62.0 in | Wt 150.4 lb

## 2015-11-21 DIAGNOSIS — I10 Essential (primary) hypertension: Secondary | ICD-10-CM | POA: Diagnosis not present

## 2015-11-21 DIAGNOSIS — Z954 Presence of other heart-valve replacement: Secondary | ICD-10-CM | POA: Diagnosis not present

## 2015-11-21 DIAGNOSIS — I712 Thoracic aortic aneurysm, without rupture: Secondary | ICD-10-CM

## 2015-11-21 DIAGNOSIS — I7121 Aneurysm of the ascending aorta, without rupture: Secondary | ICD-10-CM

## 2015-11-21 DIAGNOSIS — R0602 Shortness of breath: Secondary | ICD-10-CM | POA: Insufficient documentation

## 2015-11-21 DIAGNOSIS — E7800 Pure hypercholesterolemia, unspecified: Secondary | ICD-10-CM | POA: Diagnosis not present

## 2015-11-21 DIAGNOSIS — Z952 Presence of prosthetic heart valve: Secondary | ICD-10-CM

## 2015-11-21 MED ORDER — METOPROLOL SUCCINATE ER 50 MG PO TB24: 50.0000 mg | ORAL_TABLET | Freq: Every day | ORAL | 3 refills | Status: DC

## 2015-11-21 NOTE — Progress Notes (Addendum)
Cardiology Office Note    Date:  11/21/2015   ID:  Ryan [name], DOB 1951-02-28, MRN [id number]  PCP:  Jonathon Bellows, MD  Cardiologist: Sinclair Grooms, MD   Chief Complaint  Patient presents with  . Coronary Artery Disease  . Cardiac Valve Problem    History of Present Illness:  Ryan [name] is a 65 y.o. male for AVR and Diastolic HF. He denies chest discomfort consistent with angina. Is no peripheral edema. He has not had syncope. Also has an ascending aortic aneurysm measuring 4.6 cm by echo in 2017.  He tires more easily than previous. He denies orthopnea and PND. He will also [place] he has COPD. There is more dyspnea on exertion. He has not had syncope or tachypalpitations.    Past Medical History:  Diagnosis Date  . Ankylosing spondylitis ([place])   . Aortic aneurysm of unspecified site, ruptured [facility])    last aortic root diameter was 3.9 cm  . Atrial fibrillation ([facility])    Post-op A fib  . Benign hematuria   . BPH (benign prostatic hyperplasia)   . Chronic airway obstruction, not elsewhere classified    DOE possibly related to this problem and some restriction from ankylosing spondylitis  . Essential hypertension, benign    controlled  . 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 Mild [name] is followed clinically and by exam over the past 2-3 years.  . Hypertension   . IBS (irritable bowel syndrome)   . Pure hypercholesterolemia   . Renal insufficiency   . Thoracic aortic aneurysm [facility])    treated with aortoplasty at the time of aortic valve replacement, [facility], 09/2009    Past Surgical History:  Procedure Laterality Date  . AORTIC VALVE REPLACEMENT  09/21/09   With bioprosthesis at the [facility]    Current Medications: Outpatient Medications Prior to Visit  Medication Sig Dispense Refill  . albuterol (PROVENTIL HFA;VENTOLIN HFA) 108 (90 BASE) MCG/ACT inhaler Inhale 2 puffs into the lungs every 4  (four) hours as needed for wheezing or shortness of breath.    . cyclobenzaprine (FLEXERIL) 10 MG tablet Take 10 mg by mouth at bedtime as needed for muscle spasms.    [name] Kitchen etanercept (ENBREL) 50 MG/ML injection Inject 50 mg into the skin once a week.    . losartan (COZAAR) 25 MG tablet Take 1 tablet (25 mg total) by mouth daily. 30 tablet 3  . LUMIGAN 0.01 % SOLN Place 1 drop into both eyes daily.     . Multiple Vitamin (MULTIVITAMIN) tablet Take 1 tablet by mouth daily.    . sildenafil (VIAGRA) 50 MG tablet Take 50 mg by mouth as needed for erectile dysfunction.    . simvastatin (ZOCOR) 20 MG tablet Take 20 mg by mouth daily.    [name] Kitchen Spacer/Aero-Holding Chambers (AEROCHAMBER PLUS) inhaler Use as instructed    . vitamin B-12 (CYANOCOBALAMIN) 1000 MCG tablet Take 1,000 mcg by mouth daily.    . calcium carbonate (OS-CAL) 600 MG TABS tablet Take 600 mg by mouth daily.    . brimonidine (ALPHAGAN P) 0.1 % SOLN 1 drop daily.    . clotrimazole (LOTRIMIN) 1 % cream Apply 1 application topically 2 (two) times daily.    . metoprolol succinate (TOPROL-XL) 50 MG 24 hr tablet Take 50 mg by mouth daily. Take with or immediately following a meal.     No facility-administered medications prior to visit.  Allergies:   Review of patient's allergies indicates no known allergies.   Social History   Social History  . Marital status: Married    Spouse name: N/A  . Number of children: N/A  . Years of education: N/A   Social History Main Topics  . Smoking status: Former Smoker    Years: 10.00    Types: Cigarettes    Quit date: 03/04/2008  . Smokeless tobacco: Never Used     Comment: Has attemped to quit numerous times, 2010 being the latest  . Alcohol use 4.2 oz/week    7 Glasses of wine per week  . Drug use: No  . Sexual activity: Not Asked   Other Topics Concern  . None   Social History Narrative  . None     Family History:  The patient's family history includes Cancer in his mother; Cirrhosis  in his father; Congestive Heart Failure in his father; Emphysema in his mother; Macular degeneration in his mother; Parkinson's disease in his mother.   ROS:   Please see the history of present illness.    Has muscle aches, decreased hearing and DOE, All other systems reviewed and are negative.   PHYSICAL EXAM:   VS:  BP 120/70   Pulse 60   Ht 5\' 2"  (1.575 m)   Wt 150 lb 6.4 oz (68.2 kg)   BMI 27.51 kg/m    GEN: Well nourished, well developed, in no acute distress  HEENT: normal  Neck: no JVD, carotid bruits, or masses Cardiac: RRR; 3/6 SEM and 3/6 decrescendo murmur of MR; No rubs, or gallops,no edema . Respiratory:  clear to auscultation bilaterally, normal work of breathing GI: soft, nontender, nondistended, + BS MS: no deformity or atrophy  Skin: warm and dry, no rash Neuro:  Alert and Oriented x 3, Strength and sensation are intact Psych: euthymic mood, full affect  Wt Readings from Last 3 Encounters:  11/21/15 150 lb 6.4 oz (68.2 kg)  05/23/14 153 lb 3.2 oz (69.5 kg)  05/20/13 154 lb (69.9 kg)      Studies/Labs Reviewed:   EKG:  EKG  Sinus rhythm with normal overall appearance.  Recent Labs: No results found for requested labs within last 8760 hours.   Lipid Panel No results found for: CHOL, TRIG, HDL, CHOLHDL, VLDL, LDLCALC, LDLDIRECT  Additional studies/ records that were reviewed today include:  Echocardiogram 11/13/15: ------------------------------------------------------------------- Study Conclusions  - Left ventricle: The cavity size was normal. Wall thickness was   increased in a pattern of moderate LVH. Systolic function was   normal. The estimated ejection fraction was in the range of 60%   to 65%. Doppler parameters are consistent with abnormal left   ventricular relaxation (grade 1 diastolic dysfunction). - Aortic valve: Aortic valve prostheiss is difficult to see Peak   nad mena gradients through the valve aer 30 and 17 mm Hg   respectively.  Since previous echo gradient is mildly increased   and AI appears more severe . There was moderate regurgitation. - Mitral valve: There was mild regurgitation.    ASSESSMENT:    1. S/P AVR (aortic valve replacement)   2. Essential hypertension, benign   3. Pure hypercholesterolemia   4. Ascending aortic aneurysm ([place])   5. SOB (shortness of breath)      PLAN:  In order of problems listed above:  1. 2-D Doppler echocardiogram will be done prior to next office visit. Aortic regurgitation is moderate and slightly worse compared to prior. 2. Excellent  control but we need to increase metoprolol back up to 50 mg daily toprotect aorta. 3. Stay on statin therapy 4. CT of chest with contrast to evaluate size of aorta compared to 2015. This will be done with contrast. Metoprolol dose increased back to baseline. 5. Dyspnea needs to be evaluated with pulmonary function testing. We need to sort out if he has restrictive lung disease or obstructive lung disease or a combination. Ankylosing spondylitis is likely involved.    Medication Adjustments/Labs and Tests Ordered: Current medicines are reviewed at length with the patient today.  Concerns regarding medicines are outlined above.  Medication changes, Labs and Tests ordered today are listed in the Patient Instructions below. Patient Instructions  Medication Instructions:  1) INCREASE Metoprolol Succinate to 50mg  once daily  Labwork: None  Testing/Procedures: Your physician has requested that you have an echocardiogram prior to your one year follow up with Dr. Tamala Julian (Sept 2018.  Can do echo Aug 2018). Echocardiography is a painless test that uses sound waves to create images of your heart. It provides your doctor with information about the size and shape of your heart and how well your heart's chambers and valves are working. This procedure takes approximately one hour. There are no restrictions for this procedure.  Your physician has  recommended that you have a pulmonary function test. Pulmonary Function Tests are a group of tests that measure how well air moves in and out of your lungs.   Your physician would like for you to have a chest CT completed prior to the end of the year.   Follow-Up: Your physician wants you to follow-up in: 1 year with Dr. Tamala Julian. You will receive a reminder letter in the mail two months in advance. If you don't receive a letter, please call our office to schedule the follow-up appointment.   Any Other Special Instructions Will Be Listed Below (If Applicable).     If you need a refill on your cardiac medications before your next appointment, please call your pharmacy.      Signed, Sinclair Grooms, MD  11/21/2015 1:26 PM    [facility] [place], [place], [place]  [address] Phone: [phone]; Fax: [phone]

## 2015-11-21 NOTE — Patient Instructions (Addendum)
Medication Instructions:  1) INCREASE Metoprolol Succinate to 50mg  once daily  Labwork: None  Testing/Procedures: Your physician has requested that you have an echocardiogram prior to your one year follow up with Dr. Tamala Julian (Sept 2018.  Can do echo Aug 2018). Echocardiography is a painless test that uses sound waves to create images of your heart. It provides your doctor with information about the size and shape of your heart and how well your heart's chambers and valves are working. This procedure takes approximately one hour. There are no restrictions for this procedure.  Your physician has recommended that you have a pulmonary function test. Pulmonary Function Tests are a group of tests that measure how well air moves in and out of your lungs.   Your physician would like for you to have a chest CT completed prior to the end of the year.   Follow-Up: Your physician wants you to follow-up in: 1 year with Dr. Tamala Julian. You will receive a reminder letter in the mail two months in advance. If you don't receive a letter, please call our office to schedule the follow-up appointment.   Any Other Special Instructions Will Be Listed Below (If Applicable).     If you need a refill on your cardiac medications before your next appointment, please call your pharmacy.

## 2015-11-22 ENCOUNTER — Encounter (INDEPENDENT_AMBULATORY_CARE_PROVIDER_SITE_OTHER): Payer: Medicare Other | Admitting: Internal Medicine

## 2015-11-22 DIAGNOSIS — R0602 Shortness of breath: Secondary | ICD-10-CM

## 2015-11-22 LAB — BASIC METABOLIC PANEL
BUN: 27 mg/dL — ABNORMAL HIGH (ref 7–25)
CO2: 25 mmol/L (ref 20–31)
Calcium: 9.2 mg/dL (ref 8.6–10.3)
Chloride: 103 mmol/L (ref 98–110)
Creat: 1.36 mg/dL — ABNORMAL HIGH (ref 0.70–1.25)
Glucose, Bld: 82 mg/dL (ref 65–99)
Potassium: 4.6 mmol/L (ref 3.5–5.3)
Sodium: 140 mmol/L (ref 135–146)

## 2015-11-22 LAB — PULMONARY FUNCTION TEST
DL/VA % pred: 98 %
DL/VA: 4.1 ml/min/mmHg/L
DLCO cor % pred: 70 %
DLCO cor: 17.22 ml/min/mmHg
DLCO unc % pred: 69 %
DLCO unc: 16.81 ml/min/mmHg
FEF 25-75 Post: 1.42 L/sec
FEF 25-75 Pre: 1.45 L/sec
FEF2575-%Change-Post: -2 %
FEF2575-%Pred-Post: 65 %
FEF2575-%Pred-Pre: 67 %
FEV1-%Change-Post: 0 %
FEV1-%Pred-Post: 72 %
FEV1-%Pred-Pre: 71 %
FEV1-Post: 1.93 L
FEV1-Pre: 1.93 L
FEV1FVC-%Change-Post: 5 %
FEV1FVC-%Pred-Pre: 101 %
FEV6-%Change-Post: -4 %
FEV6-%Pred-Post: 71 %
FEV6-%Pred-Pre: 74 %
FEV6-Post: 2.43 L
FEV6-Pre: 2.54 L
FEV6FVC-%Change-Post: 0 %
FEV6FVC-%Pred-Post: 105 %
FEV6FVC-%Pred-Pre: 105 %
FVC-%Change-Post: -4 %
FVC-%Pred-Post: 67 %
FVC-%Pred-Pre: 71 %
FVC-Post: 2.44 L
FVC-Pre: 2.57 L
Post FEV1/FVC ratio: 79 %
Post FEV6/FVC ratio: 100 %
Pre FEV1/FVC ratio: 75 %
Pre FEV6/FVC Ratio: 99 %
RV % pred: 74 %
RV: 1.52 L
TLC % pred: 76 %
TLC: 4.46 L

## 2015-11-27 ENCOUNTER — Telehealth: Payer: Self-pay | Admitting: Interventional Cardiology

## 2015-11-27 DIAGNOSIS — J449 Chronic obstructive pulmonary disease, unspecified: Secondary | ICD-10-CM

## 2015-11-27 DIAGNOSIS — R0602 Shortness of breath: Secondary | ICD-10-CM

## 2015-11-27 NOTE — Telephone Encounter (Signed)
-----   Message from Belva Crome, MD sent at 11/24/2015  1:31 PM EDT ----- Let the patient know the pulmonary function tests is mildly abnormal. It reveals Mild Obstructive Airways Disease, Insignificant response to bronchodilator, Minimal Restriction -Parenchymal, and Mild Diffusion Defect. May need to have a pulmonary consultation although the results seem relatively mild. We will ask Dr. Justin Mend to also advised concerning this. A copy will be sent to Jonathon Bellows, MD

## 2015-11-27 NOTE — Telephone Encounter (Signed)
Returned pt call. Called to give pt results. [email or web address]

## 2015-11-27 NOTE — Telephone Encounter (Signed)
Follow Up: ° ° ° ° °Returning your call from Thursday. °

## 2015-11-30 ENCOUNTER — Ambulatory Visit (INDEPENDENT_AMBULATORY_CARE_PROVIDER_SITE_OTHER)
Admission: RE | Admit: 2015-11-30 | Discharge: 2015-11-30 | Disposition: A | Discharge status: Home / Self Care | Payer: Medicare Other | Source: Ambulatory Visit | Attending: Interventional Cardiology | Admitting: Interventional Cardiology

## 2015-11-30 DIAGNOSIS — I7121 Aneurysm of the ascending aorta, without rupture: Secondary | ICD-10-CM

## 2015-11-30 DIAGNOSIS — I712 Thoracic aortic aneurysm, without rupture: Secondary | ICD-10-CM

## 2015-11-30 MED ORDER — IOPAMIDOL (ISOVUE-370) INJECTION 76%
100.0000 mL | Freq: Once | INTRAVENOUS | Status: AC | PRN
  Administered 2015-11-30: 100 mL via INTRAVENOUS

## 2015-11-30 NOTE — Telephone Encounter (Signed)
Advised pt of results of PFT's.  Pt verbalized understanding and was in agreement with plan. Pt would like to move forward with the plan to see pulmonology. Advised I will place referral and their office will call him with an appt. Pt appreciative for assistance.

## 2015-12-20 ENCOUNTER — Telehealth: Payer: Self-pay | Admitting: Interventional Cardiology

## 2015-12-20 NOTE — Telephone Encounter (Signed)
Pt c/o of Chest Pain: 1. Are you having CP right now? Yes 2. Are you experiencing any other symptoms (ex. SOB, nausea, vomiting, sweating)? No 3. How long have you been experiencing CP?about a week  4. Is your CP continuous or coming and going? Coming and going  5. Have you taken Nitroglycerin?No ( took some aspirin the other day )

## 2015-12-20 NOTE — Telephone Encounter (Signed)
Call transferred into triage for chest pain.  Pt states it is coming and going x 1 week.  He had been working, cleaning his deck, but it does not feel musculoskeletal to him.   He notices it most at rest.  It does not radiate.    No SOB.  He does not have CAD listed, has AVR that he says has been leaking.  Also has a known aortic aneurysm that he is afraid has gotten bigger.  Had recent CT to evaluate.  He is going to see Rosaria Ferries tomorrow morning.  In the meantime, I advised to try Mylanta in case this is GI related.  He is very appreciative for the assistance.

## 2015-12-21 ENCOUNTER — Encounter (HOSPITAL_COMMUNITY): Payer: Self-pay | Admitting: Emergency Medicine

## 2015-12-21 ENCOUNTER — Encounter: Payer: Self-pay | Admitting: Physician Assistant

## 2015-12-21 ENCOUNTER — Emergency Department (HOSPITAL_COMMUNITY): Payer: Medicare Other

## 2015-12-21 ENCOUNTER — Emergency Department (HOSPITAL_COMMUNITY)
Admission: EM | Admit: 2015-12-21 | Discharge: 2015-12-21 | Disposition: A | Discharge status: Home / Self Care | Payer: Medicare Other | Attending: Emergency Medicine | Admitting: Emergency Medicine

## 2015-12-21 ENCOUNTER — Ambulatory Visit (INDEPENDENT_AMBULATORY_CARE_PROVIDER_SITE_OTHER): Payer: Medicare Other | Admitting: Physician Assistant

## 2015-12-21 VITALS — BP 124/82 | HR 54 | Ht 62.0 in | Wt 151.8 lb

## 2015-12-21 DIAGNOSIS — Z87891 Personal history of nicotine dependence: Secondary | ICD-10-CM | POA: Diagnosis not present

## 2015-12-21 DIAGNOSIS — Z953 Presence of xenogenic heart valve: Secondary | ICD-10-CM | POA: Diagnosis not present

## 2015-12-21 DIAGNOSIS — J449 Chronic obstructive pulmonary disease, unspecified: Secondary | ICD-10-CM | POA: Insufficient documentation

## 2015-12-21 DIAGNOSIS — Z09 Encounter for follow-up examination after completed treatment for conditions other than malignant neoplasm: Secondary | ICD-10-CM | POA: Diagnosis not present

## 2015-12-21 DIAGNOSIS — Z79899 Other long term (current) drug therapy: Secondary | ICD-10-CM | POA: Insufficient documentation

## 2015-12-21 DIAGNOSIS — I10 Essential (primary) hypertension: Secondary | ICD-10-CM | POA: Insufficient documentation

## 2015-12-21 DIAGNOSIS — J45909 Unspecified asthma, uncomplicated: Secondary | ICD-10-CM | POA: Diagnosis not present

## 2015-12-21 DIAGNOSIS — R0789 Other chest pain: Secondary | ICD-10-CM | POA: Insufficient documentation

## 2015-12-21 DIAGNOSIS — R072 Precordial pain: Secondary | ICD-10-CM | POA: Diagnosis not present

## 2015-12-21 DIAGNOSIS — I712 Thoracic aortic aneurysm, without rupture, unspecified: Secondary | ICD-10-CM

## 2015-12-21 DIAGNOSIS — R079 Chest pain, unspecified: Secondary | ICD-10-CM

## 2015-12-21 LAB — BASIC METABOLIC PANEL
Anion gap: 6 (ref 5–15)
BUN: 33 mg/dL — ABNORMAL HIGH (ref 6–20)
CO2: 25 mmol/L (ref 22–32)
Calcium: 9.2 mg/dL (ref 8.9–10.3)
Chloride: 108 mmol/L (ref 101–111)
Creatinine, Ser: 1.57 mg/dL — ABNORMAL HIGH (ref 0.61–1.24)
GFR calc Af Amer: 52 mL/min — ABNORMAL LOW (ref >60)
GFR calc non Af Amer: 45 mL/min — ABNORMAL LOW (ref >60)
Glucose, Bld: 126 mg/dL — ABNORMAL HIGH (ref 65–99)
Potassium: 4.4 mmol/L (ref 3.5–5.1)
Sodium: 139 mmol/L (ref 135–145)

## 2015-12-21 LAB — I-STAT TROPONIN, ED
Troponin i, poc: 0 ng/mL (ref 0.00–0.08)
Troponin i, poc: 0 ng/mL (ref 0.00–0.08)

## 2015-12-21 LAB — CBC
HCT: 44.1 % (ref 39.0–52.0)
Hemoglobin: 14.8 g/dL (ref 13.0–17.0)
MCH: 33.3 pg (ref 26.0–34.0)
MCHC: 33.6 g/dL (ref 30.0–36.0)
MCV: 99.1 fL (ref 78.0–100.0)
Platelets: 183 10*3/uL (ref 150–400)
RBC: 4.45 MIL/uL (ref 4.22–5.81)
RDW: 13 % (ref 11.5–15.5)
WBC: 9.4 10*3/uL (ref 4.0–10.5)

## 2015-12-21 NOTE — Patient Instructions (Addendum)
Medications:  Your physician recommends that you continue on your current medications as directed. Please refer to the Current Medication list given to you today.  Testing:  Your physician has requested that you have en exercise stress myoview. For further information please visit [email or web address]. Please follow instruction sheet, as given.   Exercise Stress Electrocardiogram An exercise stress electrocardiogram is a test to check how blood flows to your heart. It is done to find areas of poor blood flow. You will need to walk on a treadmill for this test. The electrocardiogram will record your heartbeat when you are at rest and when you are exercising. BEFORE THE PROCEDURE  Do not have drinks with caffeine or foods with caffeine for 24 hours before the test, or as told by your doctor. This includes coffee, tea (even decaf tea), sodas, chocolate, and cocoa.  Follow your doctor's instructions about eating and drinking before the test.  Ask your doctor what medicines you should or should not take before the test. Take your medicines with water unless told by your doctor not to.  If you use an inhaler, bring it with you to the test.  Bring a snack to eat after the test.  Do not  smoke for 4 hours before the test.  Do not put lotions, powders, creams, or oils on your chest before the test.  Wear comfortable shoes and clothing. PROCEDURE  You will have patches put on your chest. Small areas of your chest may need to be shaved. Wires will be connected to the patches.  Your heart rate will be watched while you are resting and while you are exercising.  You will walk on the treadmill. The treadmill will slowly get faster to raise your heart rate.  The test will take about 1-2 hours. AFTER THE PROCEDURE  Your heart rate and blood pressure will be watched after the test.  You may return to your normal diet, activities, and medicines or as told by your doctor.   This information is  not intended to replace advice given to you by your health care provider. Make sure you discuss any questions you have with your health care provider.   Document Released: 08/07/2007 Document Revised: 03/11/2014 Document Reviewed: 10/26/2012 Elsevier Interactive Patient Education [employer].    Other:  --In 2011, your aorta was 4.3cm, now only 4.4cm.  --Target Heart Rate during exercise is 116 or greater.  --OK to use Voltaren gel or Mylanta as needed for pain.   Follow-Up:  Your physician recommends that you schedule a follow-up appointment in: 1st available with Dr. Lauretta Grill.  If you need a refill on your cardiac medications before your next appointment, please call your pharmacy.

## 2015-12-21 NOTE — ED Provider Notes (Signed)
[facility] DEPT Provider Note   CSN: [id number] Arrival date & time: 12/21/15  0025     History   Chief Complaint Chief Complaint  Patient presents with  . Chest Pain    HPI Ryan [name] is a 65 y.o. male.  Patient with a history of HTN, COPD, AVR with recurrent insufficiency, ascending aortic aneurysm, atrial fibrillation presents with complaint of chest pain x 3 days, intermittent, located in the left chest radiating to lateral chest wall. No SOB, worse pain with breathing, cough, or fever. No nausea or vomiting. No alleviating or aggravating factors. He reports recent cardiology visit with CTA evaluation of aneurysm on 11/30/15 noting "that its getting worse". He has a follow up visit with cardiology later this morning.   The history is provided by the patient and the spouse. No language interpreter was used.    Past Medical History:  Diagnosis Date  . Ankylosing spondylitis ([place])   . Aortic aneurysm of unspecified site, ruptured    last aortic root diameter was 3.9 cm  . Atrial fibrillation ([facility])    Post-op A fib  . Benign hematuria   . BPH (benign prostatic hyperplasia)   . Chronic airway obstruction, not elsewhere classified    DOE possibly related to this problem and some restriction from ankylosing spondylitis  . Essential hypertension, benign    controlled  . 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 Mild [name] is followed clinically and by exam over the past 2-3 years.  . Hypertension   . IBS (irritable bowel syndrome)   . Pure hypercholesterolemia   . Renal insufficiency   . Thoracic aortic aneurysm [facility])    treated with aortoplasty at the time of aortic valve replacement, [facility], 09/2009    Patient Active Problem List   Diagnosis Date Noted  . SOB (shortness of breath) 11/21/2015  . S/P AVR (aortic valve replacement) 05/20/2013  . Essential hypertension, benign 05/20/2013  . Ascending aortic  aneurysm ([place]) 05/20/2013  . COPD (chronic obstructive pulmonary disease) ([place]) 05/20/2013  . Pure hypercholesterolemia 05/20/2013    Past Surgical History:  Procedure Laterality Date  . AORTIC VALVE REPLACEMENT  09/21/09   With bioprosthesis at the [facility] Medications    Prior to Admission medications   Medication Sig Start Date End Date Taking? Authorizing Provider  albuterol (PROVENTIL HFA;VENTOLIN HFA) 108 (90 BASE) MCG/ACT inhaler Inhale 2 puffs into the lungs every 4 (four) hours as needed for wheezing or shortness of breath.    Historical Provider, MD  AMOXICILLIN PO Take 4 capsules by mouth as needed (Take one (1) hour prior to dental procedures).    Historical Provider, MD  calcium carbonate (OS-CAL) 600 MG TABS tablet Take 600 mg by mouth daily.    Historical Provider, MD  Coenzyme Q10 (COQ10) 200 MG CAPS Take 200 mg by mouth daily.    Historical Provider, MD  cyclobenzaprine (FLEXERIL) 10 MG tablet Take 10 mg by mouth at bedtime as needed for muscle spasms.    Historical Provider, MD  etanercept (ENBREL) 50 MG/ML injection Inject 50 mg into the skin once a week.    Historical Provider, MD  losartan (COZAAR) 25 MG tablet Take 1 tablet (25 mg total) by mouth daily. 01/17/14   Belva Crome, MD  LUMIGAN 0.01 % SOLN Place 1 drop into both eyes daily.  05/16/14   Historical Provider, MD  metoprolol succinate (TOPROL-XL)  50 MG 24 hr tablet Take 1 tablet (50 mg total) by mouth daily. Take with or immediately following a meal. 11/21/15 02/19/16  Belva Crome, MD  Multiple Vitamin (MULTIVITAMIN) tablet Take 1 tablet by mouth daily.    Historical Provider, MD  NAFTIN 2 % GEL Apply 1 application topically daily. 08/23/15   Historical Provider, MD  OMEGA-3 KRILL OIL PO Take 350 mg by mouth daily.    Historical Provider, MD  sildenafil (VIAGRA) 50 MG tablet Take 50 mg by mouth as needed for erectile dysfunction.    Historical Provider, MD  simvastatin (ZOCOR) 20 MG  tablet Take 20 mg by mouth daily.    Historical Provider, MD  Spacer/Aero-Holding Chambers (AEROCHAMBER PLUS) inhaler Use as instructed    Historical Provider, MD  vitamin B-12 (CYANOCOBALAMIN) 1000 MCG tablet Take 1,000 mcg by mouth daily.    Historical Provider, MD    Family History Family History  Problem Relation Age of Onset  . Parkinson's disease Mother   . Cancer Mother     bladder  . Macular degeneration Mother   . Emphysema Mother   . Cirrhosis Father   . Congestive Heart Failure Father     Social History Social History  Substance Use Topics  . Smoking status: Former Smoker    Years: 10.00    Types: Cigarettes    Quit date: 03/04/2008  . Smokeless tobacco: Never Used     Comment: Has attemped to quit numerous times, 2010 being the latest  . Alcohol use 4.2 oz/week    7 Glasses of wine per week     Allergies   Review of patient's allergies indicates no known allergies.   Review of Systems Review of Systems  Constitutional: Negative for chills and fever.  HENT: Negative.   Respiratory: Negative.  Negative for shortness of breath.   Cardiovascular: Positive for chest pain. Negative for palpitations and leg swelling.  Gastrointestinal: Negative.  Negative for abdominal pain, nausea and vomiting.  Musculoskeletal: Negative.  Negative for myalgias.  Skin: Negative.   Neurological: Negative.  Negative for syncope and light-headedness.     Physical Exam Updated Vital Signs BP 134/73 (BP Location: Right Arm)   Pulse 61   Temp 98.3 F (36.8 C) (Oral)   Resp 18   Ht 5\' 2"  (1.575 m)   Wt 67.6 kg   SpO2 97%   BMI 27.25 kg/m   Physical Exam  Constitutional: He is oriented to person, place, and time. He appears well-developed and well-nourished. No distress.  HENT:  Head: Normocephalic.  Neck: Normal range of motion. Neck supple.  Cardiovascular: Normal rate and regular rhythm.   Murmur heard. Pulmonary/Chest: Effort normal and breath sounds normal. He has  no wheezes. He has no rales. He exhibits no tenderness.  Abdominal: Soft. Bowel sounds are normal. There is no tenderness. There is no rebound and no guarding.  Musculoskeletal: Normal range of motion.  Neurological: He is alert and oriented to person, place, and time.  Skin: Skin is warm and dry. No rash noted.  Psychiatric: He has a normal mood and affect.  Nursing note and vitals reviewed.    ED Treatments / Results  Labs (all labs ordered are listed, but only abnormal results are displayed) Labs Reviewed  BASIC METABOLIC PANEL - Abnormal; Notable for the following:       Result Value   Glucose, Bld 126 (*)    BUN 33 (*)    Creatinine, Ser 1.57 (*)  GFR calc non Af Amer 45 (*)    GFR calc Af Amer 52 (*)    All other components within normal limits  CBC  I-STAT TROPOININ, ED    EKG  EKG Interpretation  Date/Time:  Thursday December 21 2015 00:32:02 EDT Ventricular Rate:  60 PR Interval:  184 QRS Duration: 88 QT Interval:  396 QTC Calculation: 396 R Axis:   81 Text Interpretation:  Normal sinus rhythm Normal ECG No previous ECGs available Confirmed by Christy Gentles  MD, DONALD ([id number]) on 12/21/2015 12:34:52 AM       Radiology Dg Chest 2 View  Result Date: 12/21/2015 CLINICAL DATA:  65 year old male with chest pressure. History of valve replacement. EXAM: CHEST  2 VIEW COMPARISON:  CT dated 11/30/2015 FINDINGS: The lungs are clear. There is no pleural effusion or pneumothorax. The cardiac silhouette is within normal limits. Median sternotomy wires and CABG vascular clips noted. No acute osseous pathology. IMPRESSION: No active cardiopulmonary disease. Electronically Signed   By: Anner Crete M.D.   On: 12/21/2015 01:46    Procedures Procedures (including critical care time)  Medications Ordered in ED Medications - No data to display   Initial Impression / Assessment and Plan / ED Course  I have reviewed the triage vital signs and the nursing notes.  Pertinent  labs & imaging results that were available during my care of the patient were reviewed by me and considered in my medical decision making (see chart for details).  Clinical Course    Troponin and delta troponin are negative. EKG unchanged, CXR unremarkable. The patient reports atypical chest pain, with input that "I think this is anxiety. He has scheduled follow up in office this morning at 8:00 with Rosaria Ferries, PA-C, with cardiology.  Final Clinical Impressions(s) / ED Diagnoses   Final diagnoses:  None  1. Chest pain  New Prescriptions New Prescriptions   No medications on file     Charlann Lange, PA-C 12/24/15 [id number]    Everlene Balls, MD 12/24/15 [id number]

## 2015-12-21 NOTE — Progress Notes (Signed)
Cardiology Office Note   Date:  12/21/2015   ID:  Ryan [name], DOB 11-17-1950, MRN [id number]  PCP:  Jonathon Bellows, MD  Cardiologist:  Dr Oscar La, PA-C   Chief Complaint  Patient presents with  . Chest Pain    History of Present Illness: Ryan [name] is a 65 y.o. male with a history of bioprosthetic AVR 2011, HTN, COPD, HLD, post-op afib CKD, IBS  Appt scheduled because pt had been having 1 week of chest pain. 10/18 ER visit for chest pain, ez neg MI, ECG ok  [name] presents for Evaluation chest pain.  He has been working out and has been exerting himself significantly. His heart rate will sometimes go quite high, in the 140s.  He has been having chest pain, it is not exertional. It starts at rest. It isn't a can last for hours. It is 3-4/10 at its worst. It does not change with deep inspiration or position change/movement. When he is having the pain, if he exercises, it gets better. He has tried aspirin, 81 mg 4, without much difference. He has also tried taking omeprazole for a few days but that did not seem to help either.   He has noted increasing shortness of breath, but no fluids, orthopnea, PND, or lower extremity edema. He has a history of COPD and is to see the pulmonologist next month.  He recalls having a heart catheterization prior to his valve surgery 6 years ago. At that time, he remembers being told that he had minimal coronary artery disease.   Past Medical History:  Diagnosis Date  . Ankylosing spondylitis ([place])   . Aortic aneurysm of unspecified site, ruptured    last aortic root diameter was 3.9 cm  . Atrial fibrillation (H[facility])    Post-op A fib  . Benign hematuria   . BPH (benign prostatic hyperplasia)   . Chronic airway obstruction, not elsewhere classified    DOE possibly related to this problem and some restriction from ankylosing spondylitis  . Essential hypertension, benign    controlled  . Heart valve replaced by other  means 2011   s/p bioprosthetic aortic valve. Mild Ryan [name] is followed clinically and by exam over the past 2-3 years.  . Hypertension   . IBS (irritable bowel syndrome)   . Pure hypercholesterolemia   . Renal insufficiency   . Thoracic aortic aneurysm [facility])    treated with aortoplasty at the time of aortic valve replacement, [facility], 09/2009    Past Surgical History:  Procedure Laterality Date  . AORTIC VALVE REPLACEMENT  09/21/09   With bioprosthesis at the [facility]    Current Outpatient Prescriptions  Medication Sig Dispense Refill  . albuterol (PROVENTIL HFA;VENTOLIN HFA) 108 (90 BASE) MCG/ACT inhaler Inhale 2 puffs into the lungs every 4 (four) hours as needed for wheezing or shortness of breath.    [name] Kitchen amoxicillin (AMOXIL) 500 MG capsule Take 2,000 mg by mouth See admin instructions. 1 hour prior to dental appointment    . Coenzyme Q10 (COQ10) 200 MG CAPS Take 200 mg by mouth daily.    . cyclobenzaprine (FLEXERIL) 10 MG tablet Take 10 mg by mouth at bedtime as needed for muscle spasms.    [name] Kitchen etanercept (ENBREL) 50 MG/ML injection Inject 50 mg into the skin once a week.    . losartan (COZAAR) 25 MG tablet Take 1 tablet (25 mg total) by mouth daily. 30 tablet 3  .  LUMIGAN 0.01 % SOLN Place 1 drop into both eyes daily.     . metoprolol succinate (TOPROL-XL) 50 MG 24 hr tablet Take 1 tablet (50 mg total) by mouth daily. Take with or immediately following a meal. 90 tablet 3  . Multiple Vitamin (MULTIVITAMIN) tablet Take 1 tablet by mouth daily.    [name] Kitchen NAFTIN 2 % GEL Apply 1 application topically daily.    . OMEGA-3 KRILL OIL PO Take 350 mg by mouth daily.    . sildenafil (VIAGRA) 50 MG tablet Take 50 mg by mouth as needed for erectile dysfunction.    . simvastatin (ZOCOR) 20 MG tablet Take 20 mg by mouth daily.    [name] Kitchen Spacer/Aero-Holding Chambers (AEROCHAMBER PLUS) inhaler Use as instructed    . vitamin B-12 (CYANOCOBALAMIN) 1000 MCG tablet Take 1,000 mcg by mouth  daily.     No current facility-administered medications for this visit.     Allergies:   Review of patient's allergies indicates no known allergies.    Social History:  The patient  reports that he quit smoking about 7 years ago. His smoking use included Cigarettes. He quit after 10.00 years of use. He has never used smokeless tobacco. He reports that he drinks about 4.2 oz of alcohol per week . He reports that he does not use drugs.   Family History:  The patient's family history includes Cancer in his mother; Cirrhosis in his father; Congestive Heart Failure in his father; Emphysema in his mother; Macular degeneration in his mother; Parkinson's disease in his mother.    ROS:  Please see the history of present illness. All other systems are reviewed and negative.    PHYSICAL EXAM: VS:  BP 124/82   Pulse (!) 54   Ht 5\' 2"  (1.575 m)   Wt 151 lb 12.8 oz (68.9 kg)   BMI 27.76 kg/m  , BMI Body mass index is 27.76 kg/m. GEN: Well nourished, well developed, male in no acute distress  HEENT: normal for age  Neck: no JVD, no carotid bruit, no masses Cardiac: RRR; 2/6 murmur, no rubs, or gallops; L chest not tender in the area of the pain. Respiratory: Few scattered rales, but generally clear to auscultation bilaterally, normal work of breathing GI: soft, nontender, nondistended, + BS MS: no deformity or atrophy; no edema; distal pulses are 2+ in all 4 extremities   Skin: warm and dry, no rash Neuro:  Strength and sensation are intact Psych: euthymic mood, full affect   EKG:  EKG is ordered today. The ekg ordered today demonstrates sinus bradycardia, heart rate 54, early report, no acute ischemic changes  ECHO: 11/21/2015 - Left ventricle: The cavity size was normal. Wall thickness was   increased in a pattern of moderate LVH. Systolic function was   normal. The estimated ejection fraction was in the range of 60%   to 65%. Doppler parameters are consistent with abnormal left    ventricular relaxation (grade 1 diastolic dysfunction). - Aortic valve: Aortic valve prosthesis is difficult to see Peak    and mean gradients through the valve aer 30 and 17 mm Hg   respectively. Since previous echo gradient is mildly increased   and AI appears more severe . There was moderate regurgitation. - Mitral valve: There was mild regurgitation.  Recent Labs: 12/21/2015: BUN 33; Creatinine, Ser 1.57; Hemoglobin 14.8; Platelets 183; Potassium 4.4; Sodium 139    Lipid Panel No results found for: CHOL, TRIG, HDL, CHOLHDL, VLDL, LDLCALC, LDLDIRECT   Wt  Readings from Last 3 Encounters:  12/21/15 151 lb 12.8 oz (68.9 kg)  12/21/15 149 lb (67.6 kg)  11/21/15 150 lb 6.4 oz (68.2 kg)     Other studies Reviewed: Additional studies/ records that were reviewed today include: Office notes and other records.  ASSESSMENT AND PLAN:  1.  Chest pain: I advised that it had been long enough since his heart catheterization that we should do a stress test. However, because his symptoms improved with exertion, and he has no acute ischemic changes on his ECG, I do not feel strongly that this is cardiac. It is okay to use Voltaren gel on his chest for the pain, or Mylanta if he feels that it is GI.  If the stress test is abnormal, add aspirin. He is already on a beta blocker. His heart rate is bradycardic but he is asymptomatic with it.  2. S/p Bioprosthetic aVR: Mean gradient in 2015 was 14 mmHg, current gradient 17 mmHg. I explained that trying to exercise hard could put extra stress on his aorta and his valve. I requested that he limit his exertion level to 5 or 6/10 when he is exercising.  3. Thoracic aortic aneurysm: On an MR angiogram of the chest in 2011, it measured 4.3 cm. On the CT angiogram last month, it was 4.4 cm. We will leave to M.D., the frequency with which it is checked. Good blood pressure control and not being overly aggressive with exercise is important.  4. Lifestyle He was  asked to limit his heart rate during exercise of 116, 75% of his age-predicted maximum. It has been considerably higher at times.  Current medicines are reviewed at length with the patient today.  The patient does not have concerns regarding medicines.  The following changes have been made:  no change  Labs/ tests ordered today include:   Orders Placed This Encounter  Procedures  . Myocardial Perfusion Imaging  . EKG 12-Lead     Disposition:   FU with Dr. Tamala Julian  Signed, Rosaria Ferries, PA-C  12/21/2015 12:42 PM    [facility] Phone: [phone]; Fax: [phone]  This note was written with the assistance of speech recognition software. Please excuse any transcriptional errors.

## 2015-12-21 NOTE — ED Triage Notes (Addendum)
Pt presents to ED for assessment of left sided CP starting approx 3 days ago.  Pt has an appt with his cardiologist tomorrow, but states pain has increased this evening.  Pt denies SOB.  Denies nausea, vomtiing or any other associated symptoms.  Pt sts "I feel like some of this might have been some of my anxiety".  Pt describes the pain as pressure and tightness.  Hx of aortic aneurysm and "leaky valve".    Pt took 324 ASA before arrival and omeprazole.

## 2016-01-02 ENCOUNTER — Inpatient Hospital Stay (HOSPITAL_COMMUNITY): Admission: RE | Admit: 2016-01-02 | Payer: Medicare Other | Source: Ambulatory Visit

## 2016-01-04 ENCOUNTER — Institutional Professional Consult (permissible substitution): Payer: Medicare Other | Admitting: Pulmonary Disease

## 2016-01-15 ENCOUNTER — Ambulatory Visit (INDEPENDENT_AMBULATORY_CARE_PROVIDER_SITE_OTHER): Payer: Medicare Other | Admitting: Pulmonary Disease

## 2016-01-15 ENCOUNTER — Encounter: Payer: Self-pay | Admitting: Pulmonary Disease

## 2016-01-15 DIAGNOSIS — Z23 Encounter for immunization: Secondary | ICD-10-CM

## 2016-01-15 DIAGNOSIS — Z720 Tobacco use: Secondary | ICD-10-CM | POA: Diagnosis not present

## 2016-01-15 DIAGNOSIS — J411 Mucopurulent chronic bronchitis: Secondary | ICD-10-CM

## 2016-01-15 NOTE — Progress Notes (Signed)
Subjective:    Patient ID: Ryan [name], male    DOB: 07/18/1950, 65 y.o.   MRN: [id number]  Synopsis: Referred in November 2017 for evaluation of COPD. Has a history of aortic valve disease status post aortic valve replacement.  HPI Chief Complaint  Patient presents with  . Advice Only    Referred by Drs. Justin Mend and Clayton for worsening SOB.  Pt states he was dx'ed with COPD in 2011.    Ryan [name] has been having some trouble breathing when he exercises: > specifically he notes this when he is exercising regulalry at the gym > he has a hard time with the stair stepper machine especially > he notes that his heart rate will go up when he is dyspneic > this has been developing over the last several months  > happening faster now than before > he feels like his oxygen level is low > associated with wheezing (audible) > gets better if he stops > swimming will make him more short of breath  He doesn't cough much unless he smokes more cigars.  He still smokes a cigar rarely.    He was told that he had COPD when he went for his cardiac surgery at the [facility] clinic.  It sounds like he has both restrictive and obstructive lung disease.  Past Medical History:  Diagnosis Date  . Ankylosing spondylitis ([place])   . Aortic aneurysm of unspecified site, ruptured    last aortic root diameter was 3.9 cm  . Atrial fibrillation (HCC)    Post-op A fib  . Benign hematuria   . BPH (benign prostatic hyperplasia)   . Chronic airway obstruction, not elsewhere classified    DOE possibly related to this problem and some restriction from ankylosing spondylitis  . Essential hypertension, benign    controlled  . 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 Mild Ryan [name] is followed clinically and by exam over the past 2-3 years.  . Hypertension   . IBS (irritable bowel syndrome)   . Pure hypercholesterolemia   . Renal insufficiency   . Thoracic aortic aneurysm  [facility])    treated with aortoplasty at the time of aortic valve replacement, [facility], 09/2009   Past Medical History:  Diagnosis Date  . Ankylosing spondylitis ([place])   . Aortic aneurysm of unspecified site, ruptured    last aortic root diameter was 3.9 cm  . Atrial fibrillation (HCC)    Post-op A fib  . Benign hematuria   . BPH (benign prostatic hyperplasia)   . Chronic airway obstruction, not elsewhere classified    DOE possibly related to this problem and some restriction from ankylosing spondylitis  . Essential hypertension, benign    controlled  . 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 Mild Ryan [name] is followed clinically and by exam over the past 2-3 years.  . Hypertension   . IBS (irritable bowel syndrome)   . Pure hypercholesterolemia   . Renal insufficiency   . Thoracic aortic aneurysm [facility])    treated with aortoplasty at the time of aortic valve replacement, [facility], 09/2009     Family History  Problem Relation Age of Onset  . Parkinson's disease Mother   . Cancer Mother     bladder  . Macular degeneration Mother   . Emphysema Mother   . Cirrhosis Father   . Congestive Heart Failure Father  Social History   Social History  . Marital status: Married    Spouse name: N/A  . Number of children: N/A  . Years of education: N/A   Occupational History  . Not on file.   Social History Main Topics  . Smoking status: Current Some Day Smoker    Packs/day: 1.00    Years: 40.00    Types: Cigarettes, Cigars    Last attempt to quit: 03/04/2008  . Smokeless tobacco: Never Used     Comment: has cut back on smoking, now smokes occasional cigar per pt.  Previous 40 pack/yr history  . Alcohol use 4.2 oz/week    7 Glasses of wine per week  . Drug use: No  . Sexual activity: Not on file   Other Topics Concern  . Not on file   Social History Narrative  . No narrative on file     No Known Allergies    Outpatient Medications Prior to Visit  Medication Sig Dispense Refill  . albuterol (PROVENTIL HFA;VENTOLIN HFA) 108 (90 BASE) MCG/ACT inhaler Inhale 2 puffs into the lungs every 4 (four) hours as needed for wheezing or shortness of breath.    [name] Kitchen amoxicillin (AMOXIL) 500 MG capsule Take 2,000 mg by mouth See admin instructions. 1 hour prior to dental appointment    . Coenzyme Q10 (COQ10) 200 MG CAPS Take 200 mg by mouth daily.    . cyclobenzaprine (FLEXERIL) 10 MG tablet Take 10 mg by mouth at bedtime as needed for muscle spasms.    [name] Kitchen etanercept (ENBREL) 50 MG/ML injection Inject 50 mg into the skin once a week.    . losartan (COZAAR) 25 MG tablet Take 1 tablet (25 mg total) by mouth daily. 30 tablet 3  . LUMIGAN 0.01 % SOLN Place 1 drop into both eyes daily.     . metoprolol succinate (TOPROL-XL) 50 MG 24 hr tablet Take 1 tablet (50 mg total) by mouth daily. Take with or immediately following a meal. 90 tablet 3  . Multiple Vitamin (MULTIVITAMIN) tablet Take 1 tablet by mouth daily.    [name] Kitchen NAFTIN 2 % GEL Apply 1 application topically daily.    . OMEGA-3 KRILL OIL PO Take 350 mg by mouth daily.    . sildenafil (VIAGRA) 50 MG tablet Take 50 mg by mouth as needed for erectile dysfunction.    . simvastatin (ZOCOR) 20 MG tablet Take 20 mg by mouth daily.    [name] Kitchen Spacer/Aero-Holding Chambers (AEROCHAMBER PLUS) inhaler Use as instructed    . vitamin B-12 (CYANOCOBALAMIN) 1000 MCG tablet Take 1,000 mcg by mouth daily.     No facility-administered medications prior to visit.       Review of Systems  Constitutional: Negative for fever and unexpected weight change.  HENT: Negative for congestion, dental problem, ear pain, nosebleeds, postnasal drip, rhinorrhea, sinus pressure, sneezing, sore throat and trouble swallowing.   Eyes: Negative for redness and itching.  Respiratory: Positive for chest tightness and shortness of breath. Negative for cough and wheezing.   Cardiovascular: Positive for chest  pain. Negative for palpitations and leg swelling.  Gastrointestinal: Negative for nausea and vomiting.  Genitourinary: Negative for dysuria.  Musculoskeletal: Negative for joint swelling.  Skin: Negative for rash.  Neurological: Negative for headaches.  Hematological: Does not bruise/bleed easily.  Psychiatric/Behavioral: Negative for dysphoric mood. The patient is not nervous/anxious.        Objective:   Physical Exam Vitals:   01/15/16 1436  BP: 136/74  Pulse: 61  SpO2: 97%  Weight: 152 lb 9.6 oz (69.2 kg)  Height: 5\' 2"  (1.575 m)   RA  Gen: well appearing, no acute distress, kyphosis noted HENT: NCAT, OP clear, neck supple without masses Eyes: PERRL, EOMi Lymph: no cervical lymphadenopathy PULM: CTA B CV: RRR, slight aortic murmur noted systole, no JVD  GI: BS+, soft, nontender, no hsm Derm: no rash or skin breakdown MSK: normal bulk and tone, kyphosis Neuro: A&Ox4, CN II-XII intact, strength 5/5 in all 4 extremities Psyche: normal mood and affect    2017 CT chest images personally reviewed showing an aortic aneurysm and very mild scattered centrilobular emphysema    Assessment & Plan:  COPD (chronic obstructive pulmonary disease) (HCC) Lung function testing showed both restriction from his kyphosis and ankylosing spondylitis as well as moderate airflow obstruction. It should be noted that the degree of airflow obstruction is somewhat difficult to interpret with concomitant restriction. Given the fact that he is quite active and only experiences dyspnea with heavy exertion and he does not have exacerbations of COPD there is no clear evidence that would suggest that he needs to be treated with long acting bronchodilators. I did offer them today but he would prefer to stay away from other medicines if possible.  He desperately needs to stay away from all tobacco products at length. I think that this would decrease the mucus burden in his chest and would improve symptoms.  He voiced understanding of this.  Plan: Continue albuterol as needed Smoking cessation encouraged.  Tobacco abuse Counseled at length to quit smoking today.    Current Outpatient Prescriptions:  .  albuterol (PROVENTIL HFA;VENTOLIN HFA) 108 (90 BASE) MCG/ACT inhaler, Inhale 2 puffs into the lungs every 4 (four) hours as needed for wheezing or shortness of breath., Disp: , Rfl:  .  amoxicillin (AMOXIL) 500 MG capsule, Take 2,000 mg by mouth See admin instructions. 1 hour prior to dental appointment, Disp: , Rfl:  .  Coenzyme Q10 (COQ10) 200 MG CAPS, Take 200 mg by mouth daily., Disp: , Rfl:  .  cyclobenzaprine (FLEXERIL) 10 MG tablet, Take 10 mg by mouth at bedtime as needed for muscle spasms., Disp: , Rfl:  .  etanercept (ENBREL) 50 MG/ML injection, Inject 50 mg into the skin once a week., Disp: , Rfl:  .  losartan (COZAAR) 25 MG tablet, Take 1 tablet (25 mg total) by mouth daily., Disp: 30 tablet, Rfl: 3 .  LUMIGAN 0.01 % SOLN, Place 1 drop into both eyes daily. , Disp: , Rfl:  .  metoprolol succinate (TOPROL-XL) 50 MG 24 hr tablet, Take 1 tablet (50 mg total) by mouth daily. Take with or immediately following a meal., Disp: 90 tablet, Rfl: 3 .  Multiple Vitamin (MULTIVITAMIN) tablet, Take 1 tablet by mouth daily., Disp: , Rfl:  .  NAFTIN 2 % GEL, Apply 1 application topically daily., Disp: , Rfl:  .  OMEGA-3 KRILL OIL PO, Take 350 mg by mouth daily., Disp: , Rfl:  .  sildenafil (VIAGRA) 50 MG tablet, Take 50 mg by mouth as needed for erectile dysfunction., Disp: , Rfl:  .  simvastatin (ZOCOR) 20 MG tablet, Take 20 mg by mouth daily., Disp: , Rfl:  .  Spacer/Aero-Holding Chambers (AEROCHAMBER PLUS) inhaler, Use as instructed, Disp: , Rfl:  .  vitamin B-12 (CYANOCOBALAMIN) 1000 MCG tablet, Take 1,000 mcg by mouth daily., Disp: , Rfl:

## 2016-01-15 NOTE — Patient Instructions (Signed)
Use the albuterol inhaler as we discussed, I recommend 10 minutes before exercise with a spacer Call us back if you have develop any problems with your breathing worse than which you have now

## 2016-01-16 DIAGNOSIS — Z720 Tobacco use: Secondary | ICD-10-CM | POA: Insufficient documentation

## 2016-01-16 NOTE — Assessment & Plan Note (Signed)
Lung function testing showed both restriction from his kyphosis and ankylosing spondylitis as well as moderate airflow obstruction. It should be noted that the degree of airflow obstruction is somewhat difficult to interpret with concomitant restriction. Given the fact that he is quite active and only experiences dyspnea with heavy exertion and he does not have exacerbations of COPD there is no clear evidence that would suggest that he needs to be treated with long acting bronchodilators. I did offer them today but he would prefer to stay away from other medicines if possible.  He desperately needs to stay away from all tobacco products at length. I think that this would decrease the mucus burden in his chest and would improve symptoms. He voiced understanding of this.  Plan: Continue albuterol as needed Smoking cessation encouraged.

## 2016-01-16 NOTE — Assessment & Plan Note (Signed)
Counseled at length to quit smoking today. 

## 2016-03-25 ENCOUNTER — Ambulatory Visit: Payer: Medicare Other | Admitting: Interventional Cardiology

## 2016-10-08 ENCOUNTER — Ambulatory Visit (HOSPITAL_COMMUNITY): Payer: Medicare Other | Attending: Cardiology

## 2016-10-08 ENCOUNTER — Other Ambulatory Visit: Payer: Self-pay

## 2016-10-08 DIAGNOSIS — Z952 Presence of prosthetic heart valve: Secondary | ICD-10-CM | POA: Diagnosis not present

## 2016-10-14 ENCOUNTER — Other Ambulatory Visit: Payer: Self-pay | Admitting: Gastroenterology

## 2016-10-14 DIAGNOSIS — R1310 Dysphagia, unspecified: Secondary | ICD-10-CM

## 2016-10-16 ENCOUNTER — Ambulatory Visit
Admission: RE | Admit: 2016-10-16 | Discharge: 2016-10-16 | Disposition: A | Discharge status: Home / Self Care | Payer: Medicare Other | Source: Ambulatory Visit | Attending: Gastroenterology | Admitting: Gastroenterology

## 2016-10-16 DIAGNOSIS — R1310 Dysphagia, unspecified: Secondary | ICD-10-CM

## 2016-10-31 ENCOUNTER — Telehealth: Payer: Self-pay | Admitting: *Deleted

## 2016-10-31 DIAGNOSIS — E7800 Pure hypercholesterolemia, unspecified: Secondary | ICD-10-CM

## 2016-10-31 MED ORDER — ROSUVASTATIN CALCIUM 20 MG PO TABS: 20.0000 mg | ORAL_TABLET | Freq: Every day | ORAL | 3 refills | Status: DC

## 2016-10-31 NOTE — Telephone Encounter (Signed)
-----   Message from Belva Crome, MD sent at 10/31/2016 12:53 PM EDT ----- Let the patient know I reviewed his lipid panel. I believe we should be more aggressive with bad cholesterol (LDL) lowering. I would recommend changing simvastatin to rosuvastatin 20 mg per day. In 6-8 weeks he should have a repeat lipid panel with liver panel. A copy will be sent to Maurice Small, MD

## 2016-10-31 NOTE — Telephone Encounter (Signed)
Spoke with pt and went over results and recommendations per Dr.  Tamala Julian.  Pt verbalized understanding and was in agreement with this plan.  Pt will have labs drawn 10/17.  Pt appreciative for call.

## 2016-11-19 DIAGNOSIS — I351 Nonrheumatic aortic (valve) insufficiency: Secondary | ICD-10-CM | POA: Insufficient documentation

## 2016-11-19 NOTE — Progress Notes (Signed)
Cardiology Office Note    Date:  11/20/2016   ID:  Ryan [name], DOB [birth year], MRN [id number]  PCP:  Ryan Small, MD  Cardiologist: Ryan Grooms, MD   Chief Complaint  Patient presents with  . Cardiac Valve Problem    History of Present Illness:  Ryan [name] is a 66 y.o. 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  Ryan [name] notices that with heavy workout on the stairstepper that he gets significantly short of breath. This is been on ongoing now for several months. Other activities such as walking and swimming present no problem for him. He denies orthopnea and lower extremity swelling. He is not had any significant palpitations. He did have an emergency room visit and evaluation in October 2017 because of chest discomfort. He was seen by Ryan [name] following this ER visit and a stress test was recommended. The patient refused. He now also noted if he should have the tests. Since the emergency room visit the patient has been very active in his typical manner and has noticed no decrement or recurrence of chest discomfort. The discomfort that prompted the emergency room visit was atypical and started at rest. This was during a time that a close childhood friend died suddenly of a heart attack.  No medication side effects.  Past Medical History:  Diagnosis Date  . Ankylosing spondylitis ([place])   . Aortic aneurysm of unspecified site, ruptured [facility])    last aortic root diameter was 3.9 cm  . Atrial fibrillation (HCC)    Post-op A fib  . Benign hematuria   . BPH (benign prostatic hyperplasia)   . Chronic airway obstruction, not elsewhere classified    DOE possibly related to this problem and some restriction from ankylosing spondylitis  . Essential hypertension, benign    controlled  . 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 Mild [place]  Ryan [name] is followed clinically and by exam over the past 2-3 years.  . Hypertension   . IBS (irritable bowel syndrome)   . Pure hypercholesterolemia   . Renal insufficiency   . Thoracic aortic aneurysm [facility])    treated with aortoplasty at the time of aortic valve replacement, [facility], 09/2009    Past Surgical History:  Procedure Laterality Date  . AORTIC VALVE REPLACEMENT  09/21/09   With bioprosthesis at the [facility]    Current Medications: Outpatient Medications Prior to Visit  Medication Sig Dispense Refill  . albuterol (PROVENTIL HFA;VENTOLIN HFA) 108 (90 BASE) MCG/ACT inhaler Inhale 2 puffs into the lungs every 4 (four) hours as needed for wheezing or shortness of breath.    [name] Kitchen amoxicillin (AMOXIL) 500 MG capsule Take 2,000 mg by mouth See admin instructions. 1 hour prior to dental appointment    . Coenzyme Q10 (COQ10) 200 MG CAPS Take 200 mg by mouth daily.    . cyclobenzaprine (FLEXERIL) 10 MG tablet Take 10 mg by mouth at bedtime as needed for muscle spasms.    [name] Kitchen etanercept (ENBREL) 50 MG/ML injection Inject 50 mg into the skin once a week.    [name] Kitchen LUMIGAN 0.01 % SOLN Place 1 drop into both eyes daily.     . Multiple Vitamin (MULTIVITAMIN) tablet Take 1 tablet by mouth daily.    [name] Kitchen NAFTIN 2 % GEL Apply 1 application topically daily.    . OMEGA-3 KRILL OIL PO Take 350 mg  by mouth daily.    . rosuvastatin (CRESTOR) 20 MG tablet Take 1 tablet (20 mg total) by mouth daily. 90 tablet 3  . sildenafil (VIAGRA) 50 MG tablet Take 50 mg by mouth as needed for erectile dysfunction.    [name] Kitchen Spacer/Aero-Holding Chambers (AEROCHAMBER PLUS) inhaler Use as instructed    . vitamin B-12 (CYANOCOBALAMIN) 1000 MCG tablet Take 1,000 mcg by mouth daily.    [name] Kitchen losartan (COZAAR) 25 MG tablet Take 1 tablet (25 mg total) by mouth daily. 30 tablet 3  . metoprolol succinate (TOPROL-XL) 50 MG 24 hr tablet Take 1 tablet (50 mg total) by mouth daily. Take with or immediately following a meal.  90 tablet 3   No facility-administered medications prior to visit.      Allergies:   Patient has no known allergies.   Social History   Social History  . Marital status: Married    Spouse name: N/A  . Number of children: N/A  . Years of education: N/A   Social History Main Topics  . Smoking status: Current Some Day Smoker    Packs/day: 1.00    Years: 40.00    Types: Cigarettes, Cigars    Last attempt to quit: 03/04/2008  . Smokeless tobacco: Never Used     Comment: has cut back on smoking, now smokes occasional cigar per pt.  Previous 40 pack/yr history  . Alcohol use 4.2 oz/week    7 Glasses of wine per week  . Drug use: No  . Sexual activity: Not Asked   Other Topics Concern  . None   Social History Narrative  . None     Family History:  The patient's family history includes Cancer in his mother; Cirrhosis in his father; Congestive Heart Failure in his father; Emphysema in his mother; Macular degeneration in his mother; Parkinson's disease in his mother.   ROS:   Please see the history of present illness.    Has difficulty urinating, chronic low back discomfort, has not had syncope or palpitations, and admits to occasional anxiety concerning his long Jevity and whether we have options should his aortic valve require replacement. He has noted increasing systolic blood pressures into the 130-140 mmHg range. Such blood pressures are 2. He has concerns about whether or not All other systems reviewed and are negative.   PHYSICAL EXAM:   VS:  BP 134/76 (BP Location: Left Arm)   Pulse 61   Ht 5\' 2"  (1.575 m)   Wt 148 lb 6.4 oz (67.3 kg)   BMI 27.14 kg/m    GEN: Well nourished, well developed, in no acute distress  HEENT: normal  Neck: no JVD, carotid bruits, or masses Cardiac: RRR, rubs, or gallops,no edema. There is a 2/6 crescendo decrescendo right upper sternal border and left midsternal border systolic murmur. There is a 2/6 soft decrescendo murmur of aortic  regurgitation.  Respiratory:  clear to auscultation bilaterally, normal work of breathing GI: soft, nontender, nondistended, + BS MS: no deformity or atrophy  Skin: warm and dry, no rash Neuro:  Alert and Oriented x 3, Strength and sensation are intact Psych: euthymic mood, full affect  Wt Readings from Last 3 Encounters:  11/20/16 148 lb 6.4 oz (67.3 kg)  01/15/16 152 lb 9.6 oz (69.2 kg)  12/21/15 151 lb 12.8 oz (68.9 kg)      Studies/Labs Reviewed:   EKG:  EKG  Sinus rhythm at 61 bpm, normal PR interval, no significant abnormality.  Recent Labs: 12/21/2015: BUN 33;  Creatinine, Ser 1.57; Hemoglobin 14.8; Platelets 183; Potassium 4.4; Sodium 139   Lipid Panel No results found for: CHOL, TRIG, HDL, CHOLHDL, VLDL, LDLCALC, LDLDIRECT  Additional studies/ records that were reviewed today include:   2-D Doppler echocardiogram August 2018: ------------------------------------------------------------------- Study Conclusions   - Left ventricle: The cavity size was normal. Systolic function was   normal. The estimated ejection fraction was in the range of 60%   to 65%. Wall motion was normal; there were no regional wall   motion abnormalities. Features are consistent with a pseudonormal   left ventricular filling pattern, with concomitant abnormal   relaxation and increased filling pressure (grade 2 diastolic   dysfunction). - Aortic valve: A bioprosthesis was present but difficult to   visualize. There was moderate perivalvular regurgitation. Peak   velocity (S): 277 cm/s. Mean gradient (S): 14 mm Hg. Valve area   (Vmax): 1.2 cm^2. Regurgitation pressure half-time: 345 ms. - Mitral valve: There was trivial regurgitation. - Pulmonic valve: There was mild regurgitation.   Impressions:   - Compared to prior echo, the mean AV gradient across the   bioprosthesis is stable at 76mmHg (was 25mmHg) but the   perivalvular leak has increased and is now moderate with a PHT of   345 ms  (was 567ms).      ASSESSMENT:    1. S/P AVR (aortic valve replacement)   2. Ascending aortic aneurysm ([place])   3. Essential hypertension, benign   4. Pure hypercholesterolemia   5. SOB (shortness of breath)      PLAN:  In order of problems listed above: 1. Moderate aortic regurgitation based upon the recent echocardiogram without change in LV size or function. Clinical exam is unchanged and there has been no change in the patient's clinical status. We will continue to observe. I will slightly increase the dose of ARB to further unload the left ventricle and hopefully decrease the regurgitant volume. Losartan will be increased from 25-50 mg per day. Basic metabolic panel in one week. 2. Better blood pressure control to protect the aorta. 3. Increase losartan to 50 mg per day. 4. LDL target should be less than 100 and preferably close to 70. 5. A BNP is will be obtained to help insure there is no evidence of stress on the patient's heart.    Overall, the patient is clinically stable. We will titrate losartan to better control systolic blood pressure. Clinical follow-up in 3-6 months. He should call if any dyspnea or orthopnea.   Medication Adjustments/Labs and Tests Ordered: Current medicines are reviewed at length with the patient today.  Concerns regarding medicines are outlined above.  Medication changes, Labs and Tests ordered today are listed in the Patient Instructions below. Patient Instructions  Medication Instructions:  1) INCREASE Cozaar to 50mg  once daily  Labwork: Your physician recommends that you return for lab work in: 1 week (BMET and BNP)   Testing/Procedures: None  Follow-Up: Your physician wants you to follow-up in: 6 months with Dr. Tamala Julian. You will receive a reminder letter in the mail two months in advance. If you don't receive a letter, please call our office to schedule the follow-up appointment.   Any Other Special Instructions Will Be Listed Below  (If Applicable).     If you need a refill on your cardiac medications before your next appointment, please call your pharmacy.      Signed, Ryan Grooms, MD  11/20/2016 11:37 AM    [facility] [phone]  [address], [place], [place]  [address] Phone: [phone]; Fax: [phone]

## 2016-11-20 ENCOUNTER — Encounter: Payer: Self-pay | Admitting: Interventional Cardiology

## 2016-11-20 ENCOUNTER — Ambulatory Visit (INDEPENDENT_AMBULATORY_CARE_PROVIDER_SITE_OTHER): Payer: Medicare Other | Admitting: Interventional Cardiology

## 2016-11-20 VITALS — BP 134/76 | HR 61 | Ht 62.0 in | Wt 148.4 lb

## 2016-11-20 DIAGNOSIS — Z952 Presence of prosthetic heart valve: Secondary | ICD-10-CM

## 2016-11-20 DIAGNOSIS — E7800 Pure hypercholesterolemia, unspecified: Secondary | ICD-10-CM

## 2016-11-20 DIAGNOSIS — I10 Essential (primary) hypertension: Secondary | ICD-10-CM | POA: Diagnosis not present

## 2016-11-20 DIAGNOSIS — I7121 Aneurysm of the ascending aorta, without rupture: Secondary | ICD-10-CM

## 2016-11-20 DIAGNOSIS — R0602 Shortness of breath: Secondary | ICD-10-CM | POA: Diagnosis not present

## 2016-11-20 DIAGNOSIS — I712 Thoracic aortic aneurysm, without rupture: Secondary | ICD-10-CM | POA: Diagnosis not present

## 2016-11-20 MED ORDER — LOSARTAN POTASSIUM 50 MG PO TABS: 50.0000 mg | ORAL_TABLET | Freq: Every day | ORAL | 3 refills | Status: DC

## 2016-11-20 NOTE — Patient Instructions (Addendum)
Medication Instructions:  1) INCREASE Cozaar to 50mg  once daily  Labwork: Your physician recommends that you return for lab work in: 1 week (BMET and BNP)   Testing/Procedures: None  Follow-Up: Your physician wants you to follow-up in: 6 months with Dr. Tamala Julian. You will receive a reminder letter in the mail two months in advance. If you don't receive a letter, please call our office to schedule the follow-up appointment.   Any Other Special Instructions Will Be Listed Below (If Applicable).     If you need a refill on your cardiac medications before your next appointment, please call your pharmacy.

## 2016-12-09 ENCOUNTER — Other Ambulatory Visit: Payer: Medicare Other | Admitting: *Deleted

## 2016-12-09 DIAGNOSIS — R0602 Shortness of breath: Secondary | ICD-10-CM

## 2016-12-09 DIAGNOSIS — I10 Essential (primary) hypertension: Secondary | ICD-10-CM

## 2016-12-09 DIAGNOSIS — Z952 Presence of prosthetic heart valve: Secondary | ICD-10-CM

## 2016-12-09 LAB — BASIC METABOLIC PANEL
BUN/Creatinine Ratio: 17 (ref 10–24)
BUN: 23 mg/dL (ref 8–27)
CO2: 22 mmol/L (ref 20–29)
Calcium: 9.1 mg/dL (ref 8.6–10.2)
Chloride: 103 mmol/L (ref 96–106)
Creatinine, Ser: 1.38 mg/dL — ABNORMAL HIGH (ref 0.76–1.27)
GFR calc Af Amer: 61 mL/min/{1.73_m2} (ref >59)
GFR calc non Af Amer: 53 mL/min/{1.73_m2} — ABNORMAL LOW (ref >59)
Glucose: 98 mg/dL (ref 65–99)
Potassium: 4.5 mmol/L (ref 3.5–5.2)
Sodium: 140 mmol/L (ref 134–144)

## 2016-12-09 LAB — PRO B NATRIURETIC PEPTIDE: NT-Pro BNP: 239 pg/mL (ref 0–376)

## 2016-12-18 ENCOUNTER — Other Ambulatory Visit: Payer: Medicare Other | Admitting: *Deleted

## 2016-12-18 ENCOUNTER — Encounter: Payer: Self-pay | Admitting: Interventional Cardiology

## 2016-12-18 ENCOUNTER — Other Ambulatory Visit: Payer: Self-pay | Admitting: *Deleted

## 2016-12-18 DIAGNOSIS — E7800 Pure hypercholesterolemia, unspecified: Secondary | ICD-10-CM

## 2016-12-18 LAB — LIPID PANEL
Chol/HDL Ratio: 2.5 ratio (ref 0.0–5.0)
Cholesterol, Total: 124 mg/dL (ref 100–199)
HDL: 49 mg/dL (ref >39)
LDL Calculated: 60 mg/dL (ref 0–99)
Triglycerides: 73 mg/dL (ref 0–149)
VLDL Cholesterol Cal: 15 mg/dL (ref 5–40)

## 2016-12-18 LAB — HEPATIC FUNCTION PANEL
ALT: 19 IU/L (ref 0–44)
AST: 22 IU/L (ref 0–40)
Albumin: 4.1 g/dL (ref 3.6–4.8)
Alkaline Phosphatase: 90 IU/L (ref 39–117)
Bilirubin Total: 0.4 mg/dL (ref 0.0–1.2)
Bilirubin, Direct: 0.14 mg/dL (ref 0.00–0.40)
Total Protein: 7 g/dL (ref 6.0–8.5)

## 2016-12-18 MED ORDER — LOSARTAN POTASSIUM 50 MG PO TABS: 50.0000 mg | ORAL_TABLET | Freq: Every day | ORAL | 3 refills | Status: DC

## 2017-01-27 ENCOUNTER — Other Ambulatory Visit: Payer: Self-pay | Admitting: Interventional Cardiology

## 2017-02-18 ENCOUNTER — Other Ambulatory Visit: Payer: Self-pay

## 2017-02-18 MED ORDER — ROSUVASTATIN CALCIUM 20 MG PO TABS: 20.0000 mg | ORAL_TABLET | Freq: Every day | ORAL | 2 refills | Status: DC

## 2017-04-22 ENCOUNTER — Telehealth: Payer: Self-pay | Admitting: Interventional Cardiology

## 2017-04-22 DIAGNOSIS — I351 Nonrheumatic aortic (valve) insufficiency: Secondary | ICD-10-CM

## 2017-04-22 DIAGNOSIS — Z952 Presence of prosthetic heart valve: Secondary | ICD-10-CM

## 2017-04-22 NOTE — Telephone Encounter (Signed)
Pt's last echo was 10/08/16.  Will route to Dr. Tamala Julian to see if pt needs another echo at this time.

## 2017-04-22 NOTE — Telephone Encounter (Signed)
Patient calling, states that he always has an echocardiogram at his visits. Please verify if patient needs echo before he schedules appointment with Dr. Tamala Julian.

## 2017-04-24 NOTE — Telephone Encounter (Signed)
He does not need an echocardiogram.

## 2017-04-25 NOTE — Telephone Encounter (Signed)
Reviewed pt's chart and echo is scheduled for August and f/u appt is Sept, not now.  Last echo states we will continue to follow d/t a slight change in the valve.  Spoke with Dr. Tamala Julian and he said ok to do in August as scheduled, just didn't want one now. Left detailed message on pt's phone letting him know order has been placed and he can call back and schedule echo to be done in August.

## 2017-10-02 ENCOUNTER — Other Ambulatory Visit (HOSPITAL_COMMUNITY): Payer: Medicare Other

## 2017-10-14 ENCOUNTER — Other Ambulatory Visit: Payer: Self-pay | Admitting: Interventional Cardiology

## 2017-10-23 ENCOUNTER — Encounter: Payer: Self-pay | Admitting: Nurse Practitioner

## 2017-10-24 ENCOUNTER — Ambulatory Visit (INDEPENDENT_AMBULATORY_CARE_PROVIDER_SITE_OTHER): Payer: Medicare Other | Admitting: Psychology

## 2017-10-24 DIAGNOSIS — F4323 Adjustment disorder with mixed anxiety and depressed mood: Secondary | ICD-10-CM | POA: Diagnosis not present

## 2017-11-07 ENCOUNTER — Ambulatory Visit (INDEPENDENT_AMBULATORY_CARE_PROVIDER_SITE_OTHER): Payer: Medicare Other | Admitting: Psychology

## 2017-11-07 DIAGNOSIS — F4323 Adjustment disorder with mixed anxiety and depressed mood: Secondary | ICD-10-CM

## 2017-11-11 ENCOUNTER — Ambulatory Visit (HOSPITAL_COMMUNITY): Payer: Medicare Other | Attending: Cardiology

## 2017-11-11 ENCOUNTER — Other Ambulatory Visit: Payer: Self-pay

## 2017-11-11 DIAGNOSIS — I10 Essential (primary) hypertension: Secondary | ICD-10-CM | POA: Diagnosis not present

## 2017-11-11 DIAGNOSIS — J449 Chronic obstructive pulmonary disease, unspecified: Secondary | ICD-10-CM | POA: Insufficient documentation

## 2017-11-11 DIAGNOSIS — F17200 Nicotine dependence, unspecified, uncomplicated: Secondary | ICD-10-CM | POA: Diagnosis not present

## 2017-11-11 DIAGNOSIS — I351 Nonrheumatic aortic (valve) insufficiency: Secondary | ICD-10-CM

## 2017-11-11 DIAGNOSIS — Z953 Presence of xenogenic heart valve: Secondary | ICD-10-CM | POA: Diagnosis not present

## 2017-11-11 DIAGNOSIS — E785 Hyperlipidemia, unspecified: Secondary | ICD-10-CM | POA: Diagnosis not present

## 2017-11-11 DIAGNOSIS — I082 Rheumatic disorders of both aortic and tricuspid valves: Secondary | ICD-10-CM | POA: Diagnosis not present

## 2017-11-11 DIAGNOSIS — I77810 Thoracic aortic ectasia: Secondary | ICD-10-CM | POA: Insufficient documentation

## 2017-11-11 DIAGNOSIS — Z952 Presence of prosthetic heart valve: Secondary | ICD-10-CM | POA: Diagnosis not present

## 2017-11-17 ENCOUNTER — Ambulatory Visit: Payer: Medicare Other | Admitting: Nurse Practitioner

## 2017-11-17 ENCOUNTER — Encounter: Payer: Self-pay | Admitting: Nurse Practitioner

## 2017-11-17 VITALS — BP 104/70 | HR 64 | Ht 62.0 in | Wt 143.1 lb

## 2017-11-17 DIAGNOSIS — I10 Essential (primary) hypertension: Secondary | ICD-10-CM | POA: Diagnosis not present

## 2017-11-17 DIAGNOSIS — I7121 Aneurysm of the ascending aorta, without rupture: Secondary | ICD-10-CM

## 2017-11-17 DIAGNOSIS — I712 Thoracic aortic aneurysm, without rupture, unspecified: Secondary | ICD-10-CM

## 2017-11-17 DIAGNOSIS — Z952 Presence of prosthetic heart valve: Secondary | ICD-10-CM

## 2017-11-17 DIAGNOSIS — E7800 Pure hypercholesterolemia, unspecified: Secondary | ICD-10-CM | POA: Diagnosis not present

## 2017-11-17 LAB — HEPATIC FUNCTION PANEL
ALT: 22 IU/L (ref 0–44)
AST: 22 IU/L (ref 0–40)
Albumin: 4.6 g/dL (ref 3.6–4.8)
Alkaline Phosphatase: 92 IU/L (ref 39–117)
Bilirubin Total: 0.5 mg/dL (ref 0.0–1.2)
Bilirubin, Direct: 0.16 mg/dL (ref 0.00–0.40)
Total Protein: 7.5 g/dL (ref 6.0–8.5)

## 2017-11-17 LAB — BASIC METABOLIC PANEL
BUN/Creatinine Ratio: 23 (ref 10–24)
BUN: 30 mg/dL — ABNORMAL HIGH (ref 8–27)
CO2: 22 mmol/L (ref 20–29)
Calcium: 9.9 mg/dL (ref 8.6–10.2)
Chloride: 103 mmol/L (ref 96–106)
Creatinine, Ser: 1.33 mg/dL — ABNORMAL HIGH (ref 0.76–1.27)
GFR calc Af Amer: 63 mL/min/{1.73_m2} (ref >59)
GFR calc non Af Amer: 55 mL/min/{1.73_m2} — ABNORMAL LOW (ref >59)
Glucose: 99 mg/dL (ref 65–99)
Potassium: 5 mmol/L (ref 3.5–5.2)
Sodium: 139 mmol/L (ref 134–144)

## 2017-11-17 LAB — CBC
Hematocrit: 46 % (ref 37.5–51.0)
Hemoglobin: 15.5 g/dL (ref 13.0–17.7)
MCH: 33.3 pg — ABNORMAL HIGH (ref 26.6–33.0)
MCHC: 33.7 g/dL (ref 31.5–35.7)
MCV: 99 fL — ABNORMAL HIGH (ref 79–97)
Platelets: 199 10*3/uL (ref 150–450)
RBC: 4.65 x10E6/uL (ref 4.14–5.80)
RDW: 13.9 % (ref 12.3–15.4)
WBC: 8 10*3/uL (ref 3.4–10.8)

## 2017-11-17 LAB — TSH: TSH: 2.02 u[IU]/mL (ref 0.450–4.500)

## 2017-11-17 MED ORDER — LOSARTAN POTASSIUM 50 MG PO TABS: 25.0000 mg | ORAL_TABLET | Freq: Every day | ORAL | 3 refills | Status: DC

## 2017-11-17 NOTE — Patient Instructions (Addendum)
We will be checking the following labs today - BMET, CBC, HPF and TSH  Fasting lipids on day of CT   Medication Instructions:    Continue with your current medicines. BUT  I am cutting the Losartan back to 25 mg a day - take 1/2 tablet only    Testing/Procedures To Be Arranged:  CTA of the chest to follow up your aorta/thoracic aneurysm  Follow-Up:   See Dr. Tamala Julian in about 4 to 6 weeks    Other Special Instructions:                              Ascending Aortic Aneurysm/ Thoracic Aortic Aneurysm   Recent studies have raised concern that fluoroquinolone antibiotics could be associated with an increased risk of aortic aneurysm or aortic dissection. You should avoid use of Cipro and other associated antibiotics (flouroquinolone antibiotics )  It is  best to avoid activities that cause grunting or straining (medically referred to as a "valsalva maneuver"). This happens when a person bears down against a closed throat to increase the strength of arm or abdominal muscles. There's often a tendency to do this when lifting heavy weights, doing sit-ups, push-ups or chin-ups, etc., but it may be harmful.     An aneurysm is a bulge in an artery. It happens when blood pushes up against a weakened or damaged artery wall. A thoracic aortic aneurysm is an aneurysm that occurs in the first part of the aorta, between the heart and the diaphragm. The aorta is the main artery of the body. It supplies blood from the heart to the rest of the body. Some aneurysms may not cause symptoms or problems. However, the major concern with a thoracic aortic aneurysm is that it can enlarge and burst (rupture), or blood can flow between the layers of the wall of the aorta through a tear (aorticdissection). Both of these conditions can cause bleeding inside the body and can be life-threatening if they are not diagnosed and treated right away. What are the causes? The exact cause of this condition is not  known. What increases the risk? The following factors may make you more likely to develop this condition: Being age 32 or older. Having a hardening of the arteries caused by the buildup of fat and other substances in the lining of a blood vessel (arteriosclerosis). Having inflammation of the walls of an artery (arteritis). Having a genetic disease that weakens the body's connective tissue, such as Marfan syndrome. Having an injury or trauma to the aorta. Having an infection that is caused by bacteria, such as syphilis or staphylococcus, in the wall of the aorta (infectious aortitis). Having high blood pressure (hypertension). Being male. Being white (Caucasian). Having high cholesterol. Having a family history of aneurysms. Using tobacco. Having chronic obstructive pulmonary disease (COPD). What are the signs or symptoms? Symptoms of this condition vary depending on the size and rate of growth of the aneurysm. Most grow slowly and do not cause any symptoms. When symptoms do occur, they may include: Pain in the chest, back, sides, or abdomen. The pain may vary in intensity. A sudden onset of severe pain may indicate that the aneurysm has ruptured. Hoarseness. Cough. Shortness of breath. Swallowing problems. Swelling in the face, arms, or legs. Fever. Unexplained weight loss. How is this diagnosed? This condition may be diagnosed with: An ultrasound. X-rays. A CT scan. An MRI. Tests to check the arteries for damage or  blockages (angiogram). Most unruptured thoracic aortic aneurysms cause no symptoms, so they are often found during exams for other conditions. How is this treated? Treatment for this condition depends on: The size of the aneurysm. How fast the aneurysm is growing. Your age. Risk factors for rupture. Aneurysms that are smaller than 2.2 inches (5.5 cm) may be managed by using medicines to control blood pressure, manage pain, or fight infection. You may need regular  monitoring to see if the aneurysm is getting bigger. Your health care provider may recommend that you have an ultrasound every year or every 6 months. How often you need to have an ultrasound depends on the size of the aneurysm, how fast it is growing, and whether you have a family history of aneurysms. Surgical repair may be needed if your aneurysm is larger than 2.2 inches or if it is growing quickly. Follow these instructions at home: Eating and drinking  Eat a healthy diet. Your health care provider may recommend that you: Lower your salt (sodium) intake. In some people, too much salt can raise blood pressure and increase the risk of thoracic aortic aneurysm. Avoid foods that are high in saturated fat and cholesterol, such as red meat and dairy. Eat a diet that is low in sugar. Increase your fiber intake by including whole grains, vegetables, and fruits in your diet. Eating these foods may help to lower blood pressure. Limit or avoid alcohol as recommended by your health care provider. Lifestyle  Follow instructions from your health care provider about healthy lifestyle habits. Your health care provider may recommend that you: Do not use any products that contain nicotine or tobacco, such as cigarettes and e-cigarettes. If you need help quitting, ask your health care provider. Keep your blood pressure within normal limits. The target limit for most people is below 120/80. Check your blood pressure regularly. If it is high, ask your health care provider about ways that you can control it. Keep your blood sugar (glucose) level and cholesterol levels within normal limits. Target limits for most people are: Blood glucose level: Less than 100 mg/dL. Total cholesterol level: Less than 200 mg/dL. Maintain a healthy weight. Activity  Stay physically active and exercise regularly. Talk with your health care provider about how often you should exercise and ask which types of exercise are safe for  you. Avoid heavy lifting and activities that take a lot of effort (are strenuous). Ask your health care provider what activities are safe for you. General instructions  Keep all follow-up visits as told by your health care provider. This is important. Talk with your health care provider about regular screenings to see if the aneurysm is getting bigger. Take over-the-counter and prescription medicines only as told by your health care provider. Contact a health care provider if: You have discomfort in your upper back, neck, or abdomen. You have trouble swallowing. You have a cough or hoarseness. You have a family history of aneurysms. You have unexplained weight loss. Get help right away if: You have sudden, severe pain in your upper back and abdomen. This pain may move into your chest and arms. You have shortness of breath. You have a fever. This information is not intended to replace advice given to you by your health care provider. Make sure you discuss any questions you have with your health care provider. Document Released: 02/18/2005 Document Revised: 12/01/2015 Document Reviewed: 12/01/2015 Elsevier Interactive Patient Education  2017 [employer].   Aortic Dissection An aortic dissection happens when  there is a tear in the main blood vessel of the body (aorta). The aorta comes out of the heart, curves around, and then goes down the chest (thoracic aorta) and into the abdomen (abdominal aorta) to supply arteries with blood. The wall of the aorta has inner and outer layers. Aortic dissection occurs most often in the thoracic aorta. As the tear widens and blood flows through it, the aorta becomes "double-barreled." This means that one part of the aorta continues to carry blood to the body, but blood also flows into the tear, between the layers of the aorta. The torn part of the aorta fills with blood and swells up. This can reduce blood flow through the part of the aorta that is still  supplying blood to the body. Aortic dissection is a medical emergency. What are the causes? An aortic dissection is commonly caused by weakening of the artery wall due to high blood pressure. Other causes may include: An injury, such as from a car crash. Birth defects that affect the heart (congenital heart defects). Thickening of the artery walls. In some cases, the cause is not known. What increases the risk? The following factors may make you more likely to develop this condition: Having certain medical conditions, such as: High blood pressure (hypertension). Hardening and narrowing of the arteries (atherosclerosis). A genetic disorder that affects the connective tissue, such as Marfan syndrome or Ehlers-Danlos syndrome. A condition that causes inflammation of blood vessels, such as giant cell arteritis. Having a chest injury. Having surgery on the aorta. Being born with a congenital heart defect. Being male. Being older than age 38. Using cocaine. Smoking. Lifting heavy weights or doing other types of high-intensity resistance training. What are the signs or symptoms? Signs and symptoms of aortic dissection start suddenly. The most common symptoms are: Severe chest pain that may feel like tearing, stabbing, or sharp pain. Severe pain that spreads (radiates) to the back, neck, jaw, or abdomen. Other symptoms may include: Trouble breathing. Dizziness or fainting. Sudden weakness on one side of the body. Nausea or vomiting. Trouble swallowing. Coughing up blood. Vomiting blood. Clammy skin. How is this diagnosed? This condition may be diagnosed based on: Your symptoms. A physical exam. This may include: Listening for abnormal blood flow sounds (murmurs) in your chest or abdomen. Checking your pulse in your arms and legs. Checking your blood pressure to see whether it is low or whether there is a difference between the measurements in your right and left  arm. Electrocardiogram (ECG). This test measures the electrical activity in your heart. Chest X-ray. CT scan. MRI. Aortic angiogram. This test involves injecting dye to make it easier to see your blood vessels clearly. Echocardiogram to study your heart using sound waves. Blood tests. How is this treated? It is important to treat an aortic dissection as quickly as possible. Treatment may start as soon as your health care provider thinks that you have aortic dissection. Treatment depends on the location and severity of your dissection and your overall health. Treatment may include: Medicines to lower your blood pressure. Surgery to repair the dissected part of your aorta with artificial material (syntheticgraft). A medical procedure to insert a stent-graft into the aorta (endovascular procedure). During this procedure, a long, thin tube (stent) is inserted into an artery near the groin (femoral artery) and moved up to the damaged part of the aorta. Then, the stent is opened to help improve blood flow and prevent future dissection. Follow these instructions at home: Activity  Avoid activities that could injure your chest or your abdomen. Ask your health care provider what activities are safe for you. After you have recovered, try to stay active. Ask your health care provider what activities are safe for you after recovery. Do not lift anything that is heavier than 10 lb (4.5 kg) until your health care provider approves. Do not drive or use heavy machinery while taking prescription pain medicine. Eating and drinking  Eat a heart-healthy diet, which includes lots of fresh fruits and vegetables, low-fat (lean) protein, and whole grains. Check ingredients and nutrition facts on packaged foods and beverages, and avoid foods with high amounts of: Salt (sodium). Saturated fats (like red meat). Trans fats (like fried food). General instructions  Take over-the-counter and prescription medicines only  as told by your health care provider. Work with your health care provider to manage your blood pressure. Talk with your health care provider about how to manage stress. Do not use any products that contain nicotine or tobacco, such as cigarettes and e-cigarettes. If you need help quitting, ask your health care provider. Keep all follow-up visits as told by your health care provider. This is important. Get help right away if: You develop any symptoms of aortic dissection after treatment, including severe pain in your chest, back, or abdomen. You have a pain in your abdomen. You have trouble breathing or you develop a cough. You faint. You develop a racing heartbeat. These symptoms may represent a serious problem that is an emergency. Do not wait to see if the symptoms will go away. Get medical help right away. Call your local emergency services (911 in the [place] Do not drive yourself to the hospital. Summary An aortic dissection happens when there is a tear in the main blood vessel of the body (aorta). It is a medical emergency. The most common symptom is severe pain in the chest that spreads (radiates) to the back, neck, jaw, or abdomen. It is important to treat an aortic dissection as quickly as possible. Treatment typically includes surgery and medicines. This information is not intended to replace advice given to you by your health care provider. Make sure you discuss any questions you have with your health care provider. Document Released: 05/28/2007 Document Revised: 01/08/2016 Document Reviewed: 01/08/2016 Elsevier Interactive Patient Education  2017 [employer].     If you need a refill on your cardiac medications before your next appointment, please call your pharmacy.   Call the [place] office at [phone] if you have any questions, problems or concerns.

## 2017-11-17 NOTE — Progress Notes (Signed)
CARDIOLOGY OFFICE NOTE  Date:  11/17/2017    Ryan [name] Date of Birth: 07-Nov-1950 Medical Record #[id number]  PCP:  Ryan Small, MD  Cardiologist:  Ryan [name]    Chief Complaint  Patient presents with  . Cardiac Valve Problem    1 year check - seen for Dr. Tamala [name]    History of Present Illness: Ryan [name] is a 67 y.o. male who presents today for a follow up visit. Seen for Dr. Tamala [name].   He has a history of AVR (bioprosthetic aortic valve) - done at [facility] - with perivalvular leak, paroxysmal atrial fibrillation, ascending aortic aneurysm, and chronic diastolic HF.  Last seen in September of 2018. He had had some worsening shortness of breath. He had refused prior stress testing. ARB was increased. Was to have sooner follow up.   Comes in today. Here alone. It is a one year check. His echo was done earlier this month - we have reviewed those findings. He notes lots of fatigue and low BP.  Rarely will have fast heart beating but does note on occasion. Remains on beta blocker. BP down in the 90's - not passing out - just fatigued - he feels it is out of proportion. He has had issues getting his physical with his PCP. Not fasting today but needs labs. Weight is down. BP soft here today.   Past Medical History:  Diagnosis Date  . Ankylosing spondylitis (Ryan [name])   . Aortic aneurysm of unspecified site, ruptured [facility])    last aortic root diameter was 3.9 cm  . Atrial fibrillation (HCC)    Post-op A fib  . Benign hematuria   . BPH (benign prostatic hyperplasia)   . Chronic airway obstruction, not elsewhere classified    DOE possibly related to this problem and some restriction from ankylosing spondylitis  . Essential hypertension, benign    controlled  . 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 Mild [name] is followed clinically and by exam over the past 2-3 years.  . Hypertension   . IBS (irritable bowel syndrome)   . Pure  hypercholesterolemia   . Renal insufficiency   . Thoracic aortic aneurysm [facility])    treated with aortoplasty at the time of aortic valve replacement, [facility], 09/2009    Past Surgical History:  Procedure Laterality Date  . AORTIC VALVE REPLACEMENT  09/21/09   With bioprosthesis at the [facility]     Medications: Current Meds  Medication Sig  . albuterol (PROVENTIL HFA;VENTOLIN HFA) 108 (90 BASE) MCG/ACT inhaler Inhale 2 puffs into the lungs every 4 (four) hours as needed for wheezing or shortness of breath.  [name] Kitchen amoxicillin (AMOXIL) 500 MG capsule Take 2,000 mg by mouth See admin instructions. 1 hour prior to dental appointment  . Ascorbic Acid (VITAMIN C) 1000 MG tablet Take 1,000 mg by mouth daily.  . Coenzyme Q10 (COQ10) 200 MG CAPS Take 200 mg by mouth daily.  . cyclobenzaprine (FLEXERIL) 10 MG tablet Take 10 mg by mouth at bedtime as needed for muscle spasms.  [name] Kitchen etanercept (ENBREL) 50 MG/ML injection Inject 50 mg into the skin once a week.  Javier Docker Oil 1000 MG CAPS Take 1 capsule by mouth daily.  [name] Kitchen losartan (COZAAR) 50 MG tablet Take 0.5 tablets (25 mg total) by mouth daily.  [name] Kitchen LUMIGAN 0.01 % SOLN Place 1 drop into both eyes daily.   . metoprolol succinate (TOPROL-XL) 50 MG 24  hr tablet TAKE 1 TABLET BY MOUTH  DAILY WITH OR IMMEDIATLEY  FOLLOWING A MEAL  . Multiple Vitamin (MULTIVITAMIN) tablet Take 1 tablet by mouth daily.  [name] Kitchen NAFTIN 2 % GEL Apply 1 application topically daily.  . OMEGA-3 KRILL OIL PO Take 350 mg by mouth daily.  . rosuvastatin (CRESTOR) 20 MG tablet TAKE 1 TABLET BY MOUTH  DAILY  . sildenafil (VIAGRA) 50 MG tablet Take 50 mg by mouth as needed for erectile dysfunction.  [name] Kitchen Spacer/Aero-Holding Chambers (AEROCHAMBER PLUS) inhaler Use as instructed  . [DISCONTINUED] losartan (COZAAR) 50 MG tablet Take 1 tablet (50 mg total) by mouth daily.  . [DISCONTINUED] vitamin B-12 (CYANOCOBALAMIN) 1000 MCG tablet Take 1,000 mcg by mouth daily.     Allergies: No  Known Allergies  Social History: The patient  reports that he has been smoking cigarettes and cigars. He has a 40.00 pack-year smoking history. He has never used smokeless tobacco. He reports that he drinks about 7.0 standard drinks of alcohol per week. He reports that he does not use drugs.   Family History: The patient's family history includes Cancer in his mother; Cirrhosis in his father; Congestive Heart Failure in his father; Emphysema in his mother; Macular degeneration in his mother; Parkinson's disease in his mother.   Review of Systems: Please see the history of present illness.   Otherwise, the review of systems is positive for none.   All other systems are reviewed and negative.   Physical Exam: VS:  BP 104/70 (BP Location: Left Arm, Patient Position: Sitting, Cuff Size: Normal)   Pulse 64   Ht 5\' 2"  (1.575 m)   Wt 143 lb 1.9 oz (64.9 kg)   BMI 26.18 kg/m  .  BMI Body mass index is 26.18 kg/m.  Wt Readings from Last 3 Encounters:  11/17/17 143 lb 1.9 oz (64.9 kg)  11/20/16 148 lb 6.4 oz (67.3 kg)  01/15/16 152 lb 9.6 oz (69.2 kg)    General: Pleasant. Well developed, well nourished and in no acute distress.   HEENT: Normal.  Neck: Supple, no JVD, carotid bruits, or masses noted.  Cardiac: Regular rate and rhythm. Harsh outflow murmur. No edema.  Respiratory:  Lungs are clear to auscultation bilaterally with normal work of breathing.  GI: Soft and nontender.  MS: No deformity or atrophy. Gait and ROM intact.  Skin: Warm and dry. Color is normal.  Neuro:  Strength and sensation are intact and no gross focal deficits noted.  Psych: Alert, appropriate and with normal affect.   LABORATORY DATA:  EKG:  EKG is ordered today. This demonstrates NSR with PVC.  Lab Results  Component Value Date   WBC 9.4 12/21/2015   HGB 14.8 12/21/2015   HCT 44.1 12/21/2015   PLT 183 12/21/2015   GLUCOSE 98 12/09/2016   CHOL 124 12/18/2016   TRIG 73 12/18/2016   HDL 49 12/18/2016     LDLCALC 60 12/18/2016   ALT 19 12/18/2016   AST 22 12/18/2016   NA 140 12/09/2016   K 4.5 12/09/2016   CL 103 12/09/2016   CREATININE 1.38 (H) 12/09/2016   BUN 23 12/09/2016   CO2 22 12/09/2016        BNP (last 3 results) No results for input(s): BNP in the last 8760 hours.  ProBNP (last 3 results) Recent Labs    12/09/16 0948  PROBNP 239     Other Studies Reviewed Today:  Echo Study Conclusions 11/2017  - Left ventricle: The cavity size  was normal. Wall thickness was   normal. Systolic function was normal. The estimated ejection   fraction was in the range of 55% to 60%. Wall motion was normal;   there were no regional wall motion abnormalities. Features are   consistent with a pseudonormal left ventricular filling pattern,   with concomitant abnormal relaxation and increased filling   pressure (grade 2 diastolic dysfunction). - Aortic valve: A bioprosthesis was present. There was mild   regurgitation. Mean gradient (S): 13 mm Hg. Peak gradient (S): 32   mm Hg. - Ascending aorta: The ascending aorta was mildly dilated. - Tricuspid valve: There was mild-moderate regurgitation. - Pulmonary arteries: PA peak pressure: 31 mm Hg (S).  Impressions:  - Normal LV systolic function; moderate diastolic dysfunction; s/p   AVR with mild AI and mean gradient 13 mmHg; mildly dilated   ascending aorta (44 mm); suggest CTA to further assess; mild to   moderate TR.   Assessment/Plan:  1. Prior AVR with moderate AI - now mild by this last echo. Some issues with fatigue. BP soft - cutting ARB back today.   2. Ascending aortic aneurysm - last measured in 2017 - needs updated scan - precautions given regarding avoidance of quinolones, having good BP control and avoid Valsalva maneuver. Needs labs.   3. HTN - BP pretty soft - cutting ARB back today.   4. HLD - on statin - not fasting - he is asking for [place] to recheck if he would come back fasting - will check on day of  CT.    Current medicines are reviewed with the patient today.  The patient does not have concerns regarding medicines other than what has been noted above.  The following changes have been made:  See above.  Labs/ tests ordered today include:    Orders Placed This Encounter  Procedures  . CT ANGIO CHEST AORTA W &/OR WO CONTRAST  . Basic metabolic panel  . CBC  . Hepatic function panel  . TSH  . Lipid panel  . EKG 12-Lead     Disposition:   FU with Dr. Tamala [name] in 4 to 6 weeks.   Patient is agreeable to this plan and will call if any problems develop in the interim.   SignedTruitt Merle, NP  11/17/2017 10:16 AM  [facility] [address] [place], [place]  [address] Phone: [phone] Fax: [phone]

## 2017-11-28 ENCOUNTER — Other Ambulatory Visit: Payer: Medicare Other | Admitting: *Deleted

## 2017-11-28 ENCOUNTER — Ambulatory Visit (INDEPENDENT_AMBULATORY_CARE_PROVIDER_SITE_OTHER)
Admission: RE | Admit: 2017-11-28 | Discharge: 2017-11-28 | Disposition: A | Discharge status: Home / Self Care | Payer: Medicare Other | Source: Ambulatory Visit | Attending: Nurse Practitioner | Admitting: Nurse Practitioner

## 2017-11-28 DIAGNOSIS — I712 Thoracic aortic aneurysm, without rupture, unspecified: Secondary | ICD-10-CM

## 2017-11-28 DIAGNOSIS — E7800 Pure hypercholesterolemia, unspecified: Secondary | ICD-10-CM

## 2017-11-28 LAB — LIPID PANEL
Chol/HDL Ratio: 2.7 ratio (ref 0.0–5.0)
Cholesterol, Total: 138 mg/dL (ref 100–199)
HDL: 52 mg/dL (ref >39)
LDL Calculated: 74 mg/dL (ref 0–99)
Triglycerides: 60 mg/dL (ref 0–149)
VLDL Cholesterol Cal: 12 mg/dL (ref 5–40)

## 2017-11-28 MED ORDER — IOPAMIDOL (ISOVUE-370) INJECTION 76%
100.0000 mL | Freq: Once | INTRAVENOUS | Status: AC | PRN
  Administered 2017-11-28: 100 mL via INTRAVENOUS

## 2017-12-24 NOTE — Progress Notes (Signed)
Cardiology Office Note:    Date:  12/25/2017   ID:  Ryan [name], DOB 05/20/1950, MRN [id number]  PCP:  Maurice Small, MD  Cardiologist:  Sinclair Grooms, MD   Referring MD: Maurice Small, MD   Chief Complaint  Patient presents with  . Cardiac Valve Problem  . Thoracic Aortic Aneurysm    History of Present Illness:    Ryan [name] is a 67 y.o. 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   Angel has been meticulously active over the years.  He does endurance training.  He has gradually developed less endurance.  He denies orthopnea and significant dyspnea on exertion.  He is just not as able to go at the same intensity as 2 or 3 years ago.  This bothers him but he also understands that he is getting older, now 89.  He gets really short of breath with exercise but he pushes himself very hard.  Fatigue is not associated with chest pain.  He does notice some instability and heart rate with dramatic increases for short periods of time.  He is unaware of palpitations but his exercise equipment monitor his heart rate.  He denies lower extremities swelling, shortness of breath, and PND.  There is some dyspnea on exertion.  His leg fatigue with physical activities that he can once do.  Past Medical History:  Diagnosis Date  . Ankylosing spondylitis ([place])   . Aortic aneurysm of unspecified site, ruptured Ryan [name])    last aortic root diameter was 3.9 cm  . Atrial fibrillation (HCC)    Post-op A fib  . Benign hematuria   . BPH (benign prostatic hyperplasia)   . Chronic airway obstruction, not elsewhere classified    DOE possibly related to this problem and some restriction from ankylosing spondylitis  . Essential hypertension, benign    controlled  . 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 Mild [name] is followed clinically and  by exam over the past 2-3 years.  . Hypertension   . IBS (irritable bowel syndrome)   . Pure hypercholesterolemia   . Renal insufficiency   . Thoracic aortic aneurysm [name])    treated with aortoplasty at the time of aortic valve replacement, [facility], 09/2009    Past Surgical History:  Procedure Laterality Date  . AORTIC VALVE REPLACEMENT  09/21/09   With bioprosthesis at the [facility]    Current Medications: Current Meds  Medication Sig  . albuterol (PROVENTIL HFA;VENTOLIN HFA) 108 (90 BASE) MCG/ACT inhaler Inhale 2 puffs into the lungs every 4 (four) hours as needed for wheezing or shortness of breath.  [name] Kitchen amoxicillin (AMOXIL) 500 MG capsule Take 2,000 mg by mouth See admin instructions. 1 hour prior to dental appointment  . Ascorbic Acid (VITAMIN C) 1000 MG tablet Take 1,000 mg by mouth daily.  . Coenzyme Q10 (COQ10) 200 MG CAPS Take 200 mg by mouth daily.  . cyclobenzaprine (FLEXERIL) 10 MG tablet Take 10 mg by mouth at bedtime as needed for muscle spasms.  [name] Kitchen etanercept (ENBREL) 50 MG/ML injection Inject 50 mg into the skin once a week.  Javier Docker Oil 1000 MG CAPS Take 1 capsule by mouth daily.  [name] Kitchen losartan (COZAAR) 50 MG tablet Take 0.5 tablets (25 mg total) by mouth daily.  [name] Kitchen LUMIGAN 0.01 % SOLN Place 1 drop into both eyes daily.   [name] Kitchen  metoprolol succinate (TOPROL-XL) 50 MG 24 hr tablet TAKE 1 TABLET BY MOUTH  DAILY WITH OR IMMEDIATLEY  FOLLOWING A MEAL  . Multiple Vitamin (MULTIVITAMIN) tablet Take 1 tablet by mouth daily.  [name] Kitchen NAFTIN 2 % GEL Apply 1 application topically daily.  . OMEGA-3 KRILL OIL PO Take 350 mg by mouth daily.  . rosuvastatin (CRESTOR) 20 MG tablet TAKE 1 TABLET BY MOUTH  DAILY  . sildenafil (VIAGRA) 50 MG tablet Take 50 mg by mouth as needed for erectile dysfunction.  [name] Kitchen Spacer/Aero-Holding Chambers (AEROCHAMBER PLUS) inhaler Use as instructed  . VYZULTA 0.024 % SOLN Place 1 drop into both eyes at bedtime.     Allergies:   Patient has no known  allergies.   Social History   Socioeconomic History  . Marital status: Married    Spouse name: Not on file  . Number of children: Not on file  . Years of education: Not on file  . Highest education level: Not on file  Occupational History  . Not on file  Social Needs  . Financial resource strain: Not on file  . Food insecurity:    Worry: Not on file    Inability: Not on file  . Transportation needs:    Medical: Not on file    Non-medical: Not on file  Tobacco Use  . Smoking status: Current Some Day Smoker    Packs/day: 1.00    Years: 40.00    Pack years: 40.00    Types: Cigarettes, Cigars  . Smokeless tobacco: Never Used  . Tobacco comment: has cut back on smoking, now smokes occasional cigar per pt.  Previous 40 pack/yr history  Substance and Sexual Activity  . Alcohol use: Yes    Alcohol/week: 7.0 standard drinks    Types: 7 Glasses of wine per week  . Drug use: No  . Sexual activity: Not on file  Lifestyle  . Physical activity:    Days per week: Not on file    Minutes per session: Not on file  . Stress: Not on file  Relationships  . Social connections:    Talks on phone: Not on file    Gets together: Not on file    Attends religious service: Not on file    Active member of club or organization: Not on file    Attends meetings of clubs or organizations: Not on file    Relationship status: Not on file  Other Topics Concern  . Not on file  Social History Narrative  . Not on file     Family History: The patient's family history includes Cancer in his mother; Cirrhosis in his father; Congestive Heart Failure in his father; Emphysema in his mother; Macular degeneration in his mother; Parkinson's disease in his mother.  ROS:   Please see the history of present illness.    Occasional leg swelling, hearing loss, rash, back pain, muscle pain, and what he considers to be excessive fatigue.  He sleeps on a recliner because of ankylosing spondylitis.  All other systems  reviewed and are negative.  EKGs/Labs/Other Studies Reviewed:    The following studies were reviewed today:  Echocardiogram November 11, 2017: Study Conclusions  - Left ventricle: The cavity size was normal. Wall thickness was   normal. Systolic function was normal. The estimated ejection   fraction was in the range of 55% to 60%. Wall motion was normal;   there were no regional wall motion abnormalities. Features are   consistent with a pseudonormal left  ventricular filling pattern,   with concomitant abnormal relaxation and increased filling   pressure (grade 2 diastolic dysfunction). - Aortic valve: A bioprosthesis was present. There was mild   regurgitation. Mean gradient (S): 13 mm Hg. Peak gradient (S): 32   mm Hg. - Ascending aorta: The ascending aorta was mildly dilated. - Tricuspid valve: There was mild-moderate regurgitation. - Pulmonary arteries: PA peak pressure: 31 mm Hg (S).  Impressions:  - Normal LV systolic function; moderate diastolic dysfunction; s/p   AVR with mild AI and mean gradient 13 mmHg; mildly dilated   ascending aorta (44 mm); suggest CTA to further assess; mild to   moderate TR.  CT angios chest and aorta September 2019 IMPRESSION: Stable aneurysmal disease of the ascending thoracic aorta reaching maximal diameter of 4.4-4.5 cm. Other segments of the thoracic aorta shows stable patency. No evidence of dissection.  Aortic aneurysm NOS (ICD10-I71.9).  EKG:  EKG is not ordered today.  The ekg performed 11/17/2017 reveals sinus rhythm, prominent voltage, interpolated PVC.  No change when compared to prior tracings.  Recent Labs: 11/17/2017: ALT 22; BUN 30; Creatinine, Ser 1.33; Hemoglobin 15.5; Platelets 199; Potassium 5.0; Sodium 139; TSH 2.020  Recent Lipid Panel    Component Value Date/Time   CHOL 138 11/28/2017 0917   TRIG 60 11/28/2017 0917   HDL 52 11/28/2017 0917   CHOLHDL 2.7 11/28/2017 0917   LDLCALC 74 11/28/2017 0917     Physical Exam:    VS:  BP 106/74   Pulse 67   Ht 5\' 2"  (1.575 m)   Wt 143 lb 12.8 oz (65.2 kg)   BMI 26.30 kg/m     Wt Readings from Last 3 Encounters:  12/25/17 143 lb 12.8 oz (65.2 kg)  11/17/17 143 lb 1.9 oz (64.9 kg)  11/20/16 148 lb 6.4 oz (67.3 kg)     GEN: Evidence of ankylosed cervical thoracic and lumbar spine.  Well nourished, well developed in no acute distress HEENT: Normal NECK: No JVD. LYMPHATICS: No lymphadenopathy CARDIAC: RRR, 2/6 crescendo decrescendo right upper sternal border systolic and trace to 1+ diastolic left parasternal murmurs, no Gallop, no edema. VASCULAR: 2+ bilateral radial, popliteal, and posterior tibial pulses.  No bruits. RESPIRATORY:  Clear to auscultation without rales, wheezing or rhonchi  ABDOMEN: Soft, non-tender, non-distended, No pulsatile mass, MUSCULOSKELETAL: No deformity  SKIN: Warm and dry NEUROLOGIC:  Alert and oriented x 3 PSYCHIATRIC:  Normal affect   ASSESSMENT:    1. S/P AVR (aortic valve replacement)   2. Essential hypertension, benign   3. Ascending aortic aneurysm ([place])   4. Pure hypercholesterolemia   5. Leg fatigue   6. Panlobular emphysema ([place])   7. Tobacco abuse    PLAN:    In order of problems listed above:  1. Stable auscultation of the patient's aortic valve.  Recent echo also demonstrates stability of the valve function.  Perivalvular aortic regurgitation is not rated as mild.  LV size and function remain normal.  Continue clinical observation.  No change in overall function.  He was concerned about the perivalvular leak and worried about the possibility of having a ? third aortic operation. 2. The blood pressure is under excellent control.  Target 130/80 mmHg or less. 3. Ascending aorta measures 4.4 cm.  This will be tracked over time but is not an immediate concern. 4. LDL target less than 70.  In September LDL was 74.  No changes in therapy are required.  Continue moderate intensity  Crestor. 5.  Patient has excellent upper and lower extremity pulses.  The leg fatigue is not related to PAD.  He was concerned.  I do not believe arterial Doppler studies are indicated at this time.  We will keep an eye on his complaint. 6. Not addressed 7. Not smoking according to the patient.  Overall both the prosthetic aortic valve and aorta are stable when compared to the prior years data.  Clinical status is stable.  Blood pressure and lipid control were discussed.  He is encouraged to not overdo physical activity and to use moderate perceived exertion as his target for intensity.  He is concerned about whether he will be able to have a another operation if it becomes necessary.  He is concerned emanates from the perivalvular leak which based upon this years echo suggests is very stable.  LV size is stable.  He is reassured.  Greater than 50% of the time during this office visit was spent in education, counseling, and coordination of care related to underlying disease process and testing as outlined.    Medication Adjustments/Labs and Tests Ordered: Current medicines are reviewed at length with the patient today.  Concerns regarding medicines are outlined above.  No orders of the defined types were placed in this encounter.  No orders of the defined types were placed in this encounter.   Patient Instructions  Medication Instructions:  Your physician recommends that you continue on your current medications as directed. Please refer to the Current Medication list given to you today.  If you need a refill on your cardiac medications before your next appointment, please call your pharmacy.   Lab work: None If you have labs (blood work) drawn today and your tests are completely normal, you will receive your results only by: [name] Kitchen MyChart Message (if you have MyChart) OR . A paper copy in the mail If you have any lab test that is abnormal or we need to change your treatment, we will call you  to review the results.  Testing/Procedures: None  Follow-Up: At [facility], you and your health needs are our priority.  As part of our continuing mission to provide you with exceptional heart care, we have created designated Provider Care Teams.  These Care Teams include your primary Cardiologist (physician) and Advanced Practice Providers (APPs -  Physician Assistants and Nurse Practitioners) who all work together to provide you with the care you need, when you need it. You will need a follow up appointment in 12 months.  Please call our office 2 months in advance to schedule this appointment.  You may see Sinclair Grooms, MD or one of the following Advanced Practice Providers on your designated Care Team:   Truitt Merle, NP Cecilie Kicks, NP . Kathyrn Drown, NP  Any Other Special Instructions Will Be Listed Below (If Applicable).       Signed, Sinclair Grooms, MD  12/25/2017 4:43 PM    [place]

## 2017-12-25 ENCOUNTER — Encounter: Payer: Self-pay | Admitting: Interventional Cardiology

## 2017-12-25 ENCOUNTER — Ambulatory Visit: Payer: Medicare Other | Admitting: Interventional Cardiology

## 2017-12-25 VITALS — BP 106/74 | HR 67 | Ht 62.0 in | Wt 143.8 lb

## 2017-12-25 DIAGNOSIS — I7121 Aneurysm of the ascending aorta, without rupture: Secondary | ICD-10-CM

## 2017-12-25 DIAGNOSIS — Z952 Presence of prosthetic heart valve: Secondary | ICD-10-CM | POA: Diagnosis not present

## 2017-12-25 DIAGNOSIS — I712 Thoracic aortic aneurysm, without rupture: Secondary | ICD-10-CM

## 2017-12-25 DIAGNOSIS — I10 Essential (primary) hypertension: Secondary | ICD-10-CM

## 2017-12-25 DIAGNOSIS — R29898 Other symptoms and signs involving the musculoskeletal system: Secondary | ICD-10-CM

## 2017-12-25 DIAGNOSIS — J431 Panlobular emphysema: Secondary | ICD-10-CM

## 2017-12-25 DIAGNOSIS — E7800 Pure hypercholesterolemia, unspecified: Secondary | ICD-10-CM

## 2017-12-25 DIAGNOSIS — Z720 Tobacco use: Secondary | ICD-10-CM

## 2017-12-25 NOTE — Patient Instructions (Signed)

## 2017-12-30 ENCOUNTER — Ambulatory Visit (INDEPENDENT_AMBULATORY_CARE_PROVIDER_SITE_OTHER): Payer: Medicare Other

## 2017-12-30 ENCOUNTER — Encounter: Payer: Self-pay | Admitting: Podiatry

## 2017-12-30 ENCOUNTER — Ambulatory Visit: Payer: Medicare Other | Admitting: Podiatry

## 2017-12-30 DIAGNOSIS — M10072 Idiopathic gout, left ankle and foot: Secondary | ICD-10-CM | POA: Diagnosis not present

## 2017-12-30 DIAGNOSIS — M779 Enthesopathy, unspecified: Secondary | ICD-10-CM | POA: Diagnosis not present

## 2017-12-31 ENCOUNTER — Other Ambulatory Visit: Payer: Self-pay | Admitting: Interventional Cardiology

## 2017-12-31 LAB — URIC ACID: Uric Acid, Serum: 7.4 mg/dL (ref 4.0–8.0)

## 2018-01-05 ENCOUNTER — Telehealth: Payer: Self-pay | Admitting: *Deleted

## 2018-01-05 NOTE — Telephone Encounter (Signed)
Left message request a call to discuss labs and his current status.

## 2018-01-05 NOTE — Telephone Encounter (Signed)
-----   Message from Trula Slade, DPM sent at 01/05/2018  8:32 AM EST ----- Val- please let him know that his blood work is normal. Can you see how he is doing after the injection? Thanks.

## 2018-01-05 NOTE — Progress Notes (Signed)
Subjective:   Patient ID: Ryan [name], male   DOB: 67 y.o.   MRN: [id number]   HPI 67 year old male presents the office today for concerns of pain left big toe joint, gout.  He states that he said he developed increased pain and swelling to the left big toe after he went to the fair on [place] he states that it started off and uncomfortable and then started noticed some redness.  He said he does eat fish and red meat.  He did take meloxicam yesterday and is feeling better today.  Denies any recent injury or trauma to the area.   Review of Systems  All other systems reviewed and are negative.  Past Medical History:  Diagnosis Date  . Ankylosing spondylitis ([place])   . Aortic aneurysm of unspecified site, ruptured [facility])    last aortic root diameter was 3.9 cm  . Atrial fibrillation (HCC)    Post-op A fib  . Benign hematuria   . BPH (benign prostatic hyperplasia)   . Chronic airway obstruction, not elsewhere classified    DOE possibly related to this problem and some restriction from ankylosing spondylitis  . Essential hypertension, benign    controlled  . 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 Mild [name] is followed clinically and by exam over the past 2-3 years.  . Hypertension   . IBS (irritable bowel syndrome)   . Pure hypercholesterolemia   . Renal insufficiency   . Thoracic aortic aneurysm [facility])    treated with aortoplasty at the time of aortic valve replacement, [facility], 09/2009    Past Surgical History:  Procedure Laterality Date  . AORTIC VALVE REPLACEMENT  09/21/09   With [facility]rosthesis at the [facility]     Current Outpatient Medications:  .  albuterol (PROVENTIL HFA;VENTOLIN HFA) 108 (90 BASE) MCG/ACT inhaler, Inhale 2 puffs into the lungs every 4 (four) hours as needed for wheezing or shortness of breath., Disp: , Rfl:  .  amoxicillin (AMOXIL) 500 MG capsule, Take 2,000 mg by mouth See admin  instructions. 1 hour prior to dental appointment, Disp: , Rfl:  .  Ascorbic Acid (VITAMIN C) 1000 MG tablet, Take 1,000 mg by mouth daily., Disp: , Rfl:  .  Coenzyme Q10 (COQ10) 200 MG CAPS, Take 200 mg by mouth daily., Disp: , Rfl:  .  cyclobenzaprine (FLEXERIL) 10 MG tablet, Take 10 mg by mouth at bedtime as needed for muscle spasms., Disp: , Rfl:  .  etanercept (ENBREL) 50 MG/ML injection, Inject 50 mg into the skin once a week., Disp: , Rfl:  .  Krill Oil 1000 MG CAPS, Take 1 capsule by mouth daily., Disp: , Rfl:  .  losartan (COZAAR) 50 MG tablet, Take 0.5 tablets (25 mg total) by mouth daily., Disp: 90 tablet, Rfl: 3 .  LUMIGAN 0.01 % SOLN, Place 1 drop into both eyes daily. , Disp: , Rfl:  .  metoprolol succinate (TOPROL-XL) 50 MG 24 hr tablet, TAKE 1 TABLET BY MOUTH  DAILY WITH OR IMMEDIATLEY  FOLLOWING A MEAL, Disp: 90 tablet, Rfl: 3 .  Multiple Vitamin (MULTIVITAMIN) tablet, Take 1 tablet by mouth daily., Disp: , Rfl:  .  NAFTIN 2 % GEL, Apply 1 application topically daily., Disp: , Rfl:  .  OMEGA-3 KRILL OIL PO, Take 350 mg by mouth daily., Disp: , Rfl:  .  rosuvastatin (CRESTOR) 20 MG tablet, TAKE 1 TABLET BY MOUTH  DAILY, Disp: 90 tablet,  Rfl: 0 .  sildenafil (VIAGRA) 50 MG tablet, Take 50 mg by mouth as needed for erectile dysfunction., Disp: , Rfl:  .  Spacer/Aero-Holding Chambers (AEROCHAMBER PLUS) inhaler, Use as instructed, Disp: , Rfl:  .  VYZULTA 0.024 % SOLN, Place 1 drop into both eyes at bedtime., Disp: , Rfl:   No Known Allergies       Objective:  Physical Exam  General: AAO x3, NAD  Dermatological: Skin is warm, dry and supple bilateral. Nails x 10 are well manicured; remaining integument appears unremarkable at this time. There are no open sores, no preulcerative lesions, no rash or signs of infection present.  Vascular: Dorsalis Pedis artery and Posterior Tibial artery pedal pulses are 2/4 bilateral with immedate capillary fill time.  There is no pain with calf  compression, swelling, warmth, erythema.   Neruologic: Grossly intact via light touch bilateral. Protective threshold with Semmes Wienstein monofilament intact to all pedal sites bilateral.   Musculoskeletal: There is edema and mild erythema to the left first MPJ and there is tenderness palpation of the first MPJ mild discomfort with MPJ range of motion but there is no specific area pinpoint tenderness.  There is no fluctuation or crepitation or any signs of infection there is no ascending cellulitis.  Muscular strength 5/5 in all groups tested bilateral.  Gait: Unassisted, Nonantalgic.       Assessment:   Capsulitis left first MPJ, gout    Plan:  -Treatment options discussed including all alternatives, risks, and complications -Etiology of symptoms were discussed -X-rays were obtained and reviewed with the patient.  No evidence of acute fracture or stress fracture. -Steroid injection performed.  See procedure note below.  Discussed wearing a stiffer soled shoe.  When check a uric acid level.  Hold off on anti-inflammatories given other conditions.    Procedure: Injection Small Joint Discussed alternatives, risks, complications and verbal consent was obtained.  Location: Left 1st MTPJ Skin Prep: Betadine. Injectate: 0.5cc 0.5% marcaine plain, 1 cc kenalog 10. Disposition: Patient tolerated procedure well. Injection site dressed with a band-aid.  Post-injection care was discussed and return precautions discussed.   Trula Slade DPM

## 2018-01-13 ENCOUNTER — Ambulatory Visit: Payer: Medicare Other | Admitting: Podiatry

## 2018-01-13 NOTE — Telephone Encounter (Signed)
Left message informing pt of the results and to call with his health status after the injection.

## 2018-02-05 ENCOUNTER — Other Ambulatory Visit: Payer: Self-pay | Admitting: Interventional Cardiology

## 2018-02-23 ENCOUNTER — Other Ambulatory Visit: Payer: Self-pay

## 2018-02-23 MED ORDER — LOSARTAN POTASSIUM 50 MG PO TABS: 25.0000 mg | ORAL_TABLET | Freq: Every day | ORAL | 2 refills | Status: DC

## 2018-05-18 ENCOUNTER — Other Ambulatory Visit: Payer: Self-pay

## 2018-05-18 MED ORDER — LOSARTAN POTASSIUM 50 MG PO TABS: 25.0000 mg | ORAL_TABLET | Freq: Every day | ORAL | 1 refills | Status: DC

## 2018-10-20 ENCOUNTER — Other Ambulatory Visit: Payer: Self-pay | Admitting: Interventional Cardiology

## 2018-12-01 ENCOUNTER — Telehealth: Payer: Self-pay | Admitting: Interventional Cardiology

## 2018-12-01 DIAGNOSIS — I351 Nonrheumatic aortic (valve) insufficiency: Secondary | ICD-10-CM

## 2018-12-01 NOTE — Telephone Encounter (Signed)
Pt scheduled to see you in December.  Do you want an echo prior?

## 2018-12-01 NOTE — Telephone Encounter (Signed)
Needs echo to f/u aortic regurgitation. Should do soon. Last study ~13 months ago

## 2018-12-01 NOTE — Telephone Encounter (Signed)
New Message   Patient states he's suppose to have an echo before his appointment but he has no order in his chart.  Please call patient to advise.

## 2018-12-03 ENCOUNTER — Encounter (HOSPITAL_COMMUNITY): Payer: Self-pay | Admitting: Interventional Cardiology

## 2018-12-03 NOTE — Telephone Encounter (Signed)
Order placed for echo.

## 2018-12-26 ENCOUNTER — Other Ambulatory Visit: Payer: Self-pay | Admitting: Interventional Cardiology

## 2018-12-28 ENCOUNTER — Other Ambulatory Visit: Payer: Self-pay

## 2018-12-28 ENCOUNTER — Ambulatory Visit (HOSPITAL_COMMUNITY): Payer: Medicare Other | Attending: Cardiology

## 2018-12-28 DIAGNOSIS — I351 Nonrheumatic aortic (valve) insufficiency: Secondary | ICD-10-CM | POA: Diagnosis present

## 2019-02-07 NOTE — Progress Notes (Signed)
Cardiology Office Note:    Date:  02/08/2019   ID:  KENLY CANCHE, DOB [birth year], MRN [id number]  PCP:  Maurice Small, MD  Cardiologist:  Sinclair Grooms, MD   Referring MD: Maurice Small, MD   Chief Complaint  Patient presents with  . Cardiac Valve Problem    History of Present Illness:    Ryan [name] is a 68 y.o. male with a hx of  AVR(Bioprosthetic aortic valve) with perivalvular leak, paroxysmal atrial fibrillation, ascending aortic aneurysm,andchronicdiastolic HF.   Has been unable to exercise as previous due to the COVID-19 pandemic.  He still swims several times per week.  He notices that his endurance is not as good and he needs to swim somewhat slower.  Is able to go up and down stairs in his house twice without any symptoms of chest discomfort or dyspnea.  He denies orthopnea.  He has had no neurological complaints.  There is no swelling.  He has had fleeting localized left parasternal discomfort.  Can last up to 10 to 15 seconds.  Occurs randomly.  Past Medical History:  Diagnosis Date  . Ankylosing spondylitis ([place])   . Aortic aneurysm of unspecified site, ruptured [facility])    last aortic root diameter was 3.9 cm  . Atrial fibrillation (HCC)    Post-op A fib  . Benign hematuria   . BPH (benign prostatic hyperplasia)   . Chronic airway obstruction, not elsewhere classified    DOE possibly related to this problem and some restriction from ankylosing spondylitis  . Essential hypertension, benign    controlled  . 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 Mild [name] is followed clinically and by exam over the past 2-3 years.  . Hypertension   . IBS (irritable bowel syndrome)   . Pure hypercholesterolemia   . Renal insufficiency   . Thoracic aortic aneurysm [facility])    treated with aortoplasty at the time of aortic valve replacement, [facility], 09/2009    Past Surgical History:  Procedure Laterality Date  .  AORTIC VALVE REPLACEMENT  09/21/09   With bioprosthesis at the [facility]    Current Medications: Current Meds  Medication Sig  . albuterol (PROVENTIL HFA;VENTOLIN HFA) 108 (90 BASE) MCG/ACT inhaler Inhale 2 puffs into the lungs every 4 (four) hours as needed for wheezing or shortness of breath.  . allopurinol (ZYLOPRIM) 300 MG tablet Take 300 mg by mouth daily.  [name] Kitchen amoxicillin (AMOXIL) 500 MG capsule Take 2,000 mg by mouth See admin instructions. 1 hour prior to dental appointment  . Ascorbic Acid (VITAMIN C) 1000 MG tablet Take 1,000 mg by mouth daily.  . Coenzyme Q10 (COQ10) 200 MG CAPS Take 200 mg by mouth daily.  . colchicine 0.6 MG tablet Take 0.6 mg by mouth as needed.  . cyclobenzaprine (FLEXERIL) 10 MG tablet Take 10 mg by mouth at bedtime as needed for muscle spasms.  [name] Kitchen etanercept (ENBREL) 50 MG/ML injection Inject 50 mg into the skin once a week.  . losartan (COZAAR) 50 MG tablet TAKE ONE-HALF TABLET BY  MOUTH DAILY  . meloxicam (MOBIC) 15 MG tablet Take 15 mg by mouth as needed.  . metoprolol succinate (TOPROL-XL) 50 MG 24 hr tablet TAKE 1 TABLET BY MOUTH  DAILY WITH OR IMMEDIATLEY  FOLLOWING A MEAL  . Multiple Vitamin (MULTIVITAMIN) tablet Take 1 tablet by mouth daily.  . OMEGA-3 KRILL OIL PO Take 350 mg by mouth daily.  [name] Kitchen  rosuvastatin (CRESTOR) 20 MG tablet TAKE 1 TABLET BY MOUTH  DAILY  . sildenafil (VIAGRA) 50 MG tablet Take 50 mg by mouth as needed for erectile dysfunction.  [name] Kitchen Spacer/Aero-Holding Chambers (AEROCHAMBER PLUS) inhaler Use as instructed  . VYZULTA 0.024 % SOLN Place 1 drop into both eyes at bedtime.     Allergies:   Patient has no known allergies.   Social History   Socioeconomic History  . Marital status: Married    Spouse name: Not on file  . Number of children: Not on file  . Years of education: Not on file  . Highest education level: Not on file  Occupational History  . Not on file  Social Needs  . Financial resource strain: Not on file  .  Food insecurity    Worry: Not on file    Inability: Not on file  . Transportation needs    Medical: Not on file    Non-medical: Not on file  Tobacco Use  . Smoking status: Current Some Day Smoker    Packs/day: 1.00    Years: 40.00    Pack years: 40.00    Types: Cigarettes, Cigars  . Smokeless tobacco: Never Used  . Tobacco comment: has cut back on smoking, now smokes occasional cigar per pt.  Previous 40 pack/yr history  Substance and Sexual Activity  . Alcohol use: Yes    Alcohol/week: 7.0 standard drinks    Types: 7 Glasses of wine per week  . Drug use: No  . Sexual activity: Not on file  Lifestyle  . Physical activity    Days per week: Not on file    Minutes per session: Not on file  . Stress: Not on file  Relationships  . Social Herbalist on phone: Not on file    Gets together: Not on file    Attends religious service: Not on file    Active member of club or organization: Not on file    Attends meetings of clubs or organizations: Not on file    Relationship status: Not on file  Other Topics Concern  . Not on file  Social History Narrative  . Not on file     Family History: The patient's family history includes Cancer in his mother; Cirrhosis in his father; Congestive Heart Failure in his father; Emphysema in his mother; Macular degeneration in his mother; Parkinson's disease in his mother.  ROS:   Please see the history of present illness.    He has aches and pains related to ankylosing spondylitis.  He has multiple questions concerning his 2D Doppler echocardiogram report all other systems reviewed and are negative.  EKGs/Labs/Other Studies Reviewed:    The following studies were reviewed today:  2 D Echocardiography 12/28/2018: IMPRESSIONS  1. Left ventricular ejection fraction, by visual estimation, is 60 to 65%. The left ventricle has normal function. Normal left ventricular size. There is moderately increased left ventricular hypertrophy.  2.  Global right ventricle has normal systolic function.The right ventricular size is normal.  3. Left atrial size was normal.  4. Right atrial size was normal.  5. The mitral valve is normal in structure. No evidence of mitral valve regurgitation. No evidence of mitral stenosis.  6. The tricuspid valve is normal in structure. Tricuspid valve regurgitation is mild.  7. Aortic valve regurgitation is mild by color flow Doppler.  8. The pulmonic valve was normal in structure. Pulmonic valve regurgitation is trivial by color flow Doppler.  9. Aortic  dilatation noted. 10. There is moderate dilatation of the ascending aorta measuring 46 mm. 11. The inferior vena cava is normal in size with greater than 50% respiratory variability, suggesting right atrial pressure of 3 mmHg. 12. Normal LV systolic function; moderate LVH; moderately dilated ascending aorta (4.6 cm); s/p AVR with mean gradient of 13 mmHg and mild AI.  CT angio chest 2019: IMPRESSION: Stable aneurysmal disease of the ascending thoracic aorta reaching maximal diameter of 4.4-4.5 cm. Other segments of the thoracic aorta shows stable patency. No evidence of dissection.  EKG:  EKG on today's date reveals vertical axis, and normal appearance.  There are nondiagnostic inferior and lateral Q waves.  When compared to the prior tracing from 11/17/2017, the axis on today's exam is more rightward.  Nondiagnostic Q waves unchanged.  Today's exam reveals no PVCs.  Recent Labs: No results found for requested labs within last 8760 hours.  Recent Lipid Panel    Component Value Date/Time   CHOL 138 11/28/2017 0917   TRIG 60 11/28/2017 0917   HDL 52 11/28/2017 0917   CHOLHDL 2.7 11/28/2017 0917   LDLCALC 74 11/28/2017 0917    Physical Exam:    VS:  BP 112/64   Pulse 60   Ht 5\' 2"  (1.575 m)   Wt 147 lb 12.8 oz (67 kg)   SpO2 97%   BMI 27.03 kg/m     Wt Readings from Last 3 Encounters:  02/08/19 147 lb 12.8 oz (67 kg)  12/25/17 143 lb 12.8  oz (65.2 kg)  11/17/17 143 lb 1.9 oz (64.9 kg)     GEN: Slender, fixed forward angulation due to ankylosing spondylitis.. No acute distress HEENT: Normal NECK: No JVD. LYMPHATICS: No lymphadenopathy CARDIAC: 1-2 over 6 right upper sternal systolic murmur.  RRR without murmur, gallop, or edema. VASCULAR:  Normal Pulses. No bruits. RESPIRATORY:  Clear to auscultation without rales, wheezing or rhonchi. ABDOMEN: Soft, non-tender, non-distended, No pulsatile mass, MUSCULOSKELETAL: Forward angulation and fixation of cervical thoracic and lumbar spine with limited mobility. SKIN: Warm and dry NEUROLOGIC:  Alert and oriented x 3 PSYCHIATRIC:  Normal affect   ASSESSMENT:    1. S/P AVR (aortic valve replacement)   2. Aortic valve insufficiency, etiology of cardiac valve disease unspecified   3. Ascending aortic aneurysm ([place])   4. Essential hypertension, benign   5. Pure hypercholesterolemia   6. Tobacco abuse   7. Educated about COVID-19 virus infection    PLAN:    In order of problems listed above:  1. Echocardiogram as noted above demonstrates reasonable function of the bioprosthetic aortic valve.  He has mild regurgitation which is not changed over time.  LV size is normal.  Continue clinical follow-up. 2. See above 3. 2D echocardiogram suggests an aortic diameter of 4.6.  This is probably tangential.  It is slightly higher than previous CT readings of 44 to 45 mm.  Prior to next years office visit we would do a CT of the chest for aortic sizing. 4. Blood pressure is not too low.  He is on preventive therapy for aortic root enlargement with beta-blocker and ARB.  Diastolic will be excepted down to 50 mmHg.  Hope to keep systolic blood pressure greater than 110 mmHg. 5. Lipids should be monitored and LDL should be Less than 100. 6. Denies tobacco use 7. The 3W's is being practiced to avoid COVID-19 infection.  Plan clinical follow-up in 1 year.  Greater than 50% of the time during  this office  visit was spent in education, counseling, and coordination of care related to underlying disease process and testing as outlined.  Significant time spent related to echo report and multiple questions.    Medication Adjustments/Labs and Tests Ordered: Current medicines are reviewed at length with the patient today.  Concerns regarding medicines are outlined above.  No orders of the defined types were placed in this encounter.  No orders of the defined types were placed in this encounter.   There are no Patient Instructions on file for this visit.   Signed, Sinclair Grooms, MD  02/08/2019 8:50 AM    [place]

## 2019-02-08 ENCOUNTER — Encounter: Payer: Self-pay | Admitting: Interventional Cardiology

## 2019-02-08 ENCOUNTER — Other Ambulatory Visit: Payer: Self-pay

## 2019-02-08 ENCOUNTER — Ambulatory Visit: Payer: Medicare Other | Admitting: Interventional Cardiology

## 2019-02-08 VITALS — BP 112/64 | HR 60 | Ht 62.0 in | Wt 147.8 lb

## 2019-02-08 DIAGNOSIS — Z952 Presence of prosthetic heart valve: Secondary | ICD-10-CM

## 2019-02-08 DIAGNOSIS — I10 Essential (primary) hypertension: Secondary | ICD-10-CM | POA: Diagnosis not present

## 2019-02-08 DIAGNOSIS — Z7189 Other specified counseling: Secondary | ICD-10-CM

## 2019-02-08 DIAGNOSIS — I712 Thoracic aortic aneurysm, without rupture: Secondary | ICD-10-CM | POA: Diagnosis not present

## 2019-02-08 DIAGNOSIS — I351 Nonrheumatic aortic (valve) insufficiency: Secondary | ICD-10-CM

## 2019-02-08 DIAGNOSIS — E7800 Pure hypercholesterolemia, unspecified: Secondary | ICD-10-CM

## 2019-02-08 DIAGNOSIS — Z720 Tobacco use: Secondary | ICD-10-CM

## 2019-02-08 DIAGNOSIS — I7121 Aneurysm of the ascending aorta, without rupture: Secondary | ICD-10-CM

## 2019-02-08 NOTE — Patient Instructions (Signed)
Medication Instructions:  Your physician recommends that you continue on your current medications as directed. Please refer to the Current Medication list given to you today.  *If you need a refill on your cardiac medications before your next appointment, please call your pharmacy*  Lab Work: None If you have labs (blood work) drawn today and your tests are completely normal, you will receive your results only by: [name] Kitchen MyChart Message (if you have MyChart) OR . A paper copy in the mail If you have any lab test that is abnormal or we need to change your treatment, we will call you to review the results.  Testing/Procedures: Your physician recommends that you have a CT Angio of the Chest 1-2 weeks prior to seeing him back in December 2021.  Follow-Up: At [facility], you and your health needs are our priority.  As part of our continuing mission to provide you with exceptional heart care, we have created designated Provider Care Teams.  These Care Teams include your primary Cardiologist (physician) and Advanced Practice Providers (APPs -  Physician Assistants and Nurse Practitioners) who all work together to provide you with the care you need, when you need it.  Your next appointment:   12 month(s)  The format for your next appointment:   In Person  Provider:   You may see Sinclair Grooms, MD or one of the following Advanced Practice Providers on your designated Care Team:    Truitt Merle, NP  Cecilie Kicks, NP  Kathyrn Drown, NP   Other Instructions

## 2019-02-09 ENCOUNTER — Telehealth: Payer: Self-pay | Admitting: *Deleted

## 2019-02-09 NOTE — Telephone Encounter (Signed)
Called patient left message to call back to [phone]. Regarding CT

## 2019-02-15 ENCOUNTER — Other Ambulatory Visit: Payer: Self-pay | Admitting: Interventional Cardiology

## 2019-04-20 DIAGNOSIS — R1310 Dysphagia, unspecified: Secondary | ICD-10-CM | POA: Insufficient documentation

## 2019-04-20 DIAGNOSIS — R09A2 Foreign body sensation, throat: Secondary | ICD-10-CM | POA: Insufficient documentation

## 2019-04-20 DIAGNOSIS — H9193 Unspecified hearing loss, bilateral: Secondary | ICD-10-CM | POA: Insufficient documentation

## 2019-06-28 DIAGNOSIS — F17290 Nicotine dependence, other tobacco product, uncomplicated: Secondary | ICD-10-CM | POA: Diagnosis not present

## 2019-06-28 DIAGNOSIS — R399 Unspecified symptoms and signs involving the genitourinary system: Secondary | ICD-10-CM | POA: Diagnosis not present

## 2019-06-30 DIAGNOSIS — K625 Hemorrhage of anus and rectum: Secondary | ICD-10-CM | POA: Diagnosis not present

## 2019-06-30 DIAGNOSIS — R197 Diarrhea, unspecified: Secondary | ICD-10-CM | POA: Diagnosis not present

## 2019-08-11 DIAGNOSIS — Z1159 Encounter for screening for other viral diseases: Secondary | ICD-10-CM | POA: Diagnosis not present

## 2019-08-16 DIAGNOSIS — K5731 Diverticulosis of large intestine without perforation or abscess with bleeding: Secondary | ICD-10-CM | POA: Insufficient documentation

## 2019-08-16 DIAGNOSIS — R197 Diarrhea, unspecified: Secondary | ICD-10-CM | POA: Diagnosis not present

## 2019-08-16 DIAGNOSIS — K921 Melena: Secondary | ICD-10-CM | POA: Diagnosis not present

## 2019-08-16 DIAGNOSIS — K640 First degree hemorrhoids: Secondary | ICD-10-CM | POA: Diagnosis not present

## 2019-08-16 DIAGNOSIS — K5730 Diverticulosis of large intestine without perforation or abscess without bleeding: Secondary | ICD-10-CM | POA: Diagnosis not present

## 2019-08-16 DIAGNOSIS — K635 Polyp of colon: Secondary | ICD-10-CM | POA: Diagnosis not present

## 2019-08-18 DIAGNOSIS — K635 Polyp of colon: Secondary | ICD-10-CM | POA: Diagnosis not present

## 2019-10-09 ENCOUNTER — Ambulatory Visit: Payer: Self-pay

## 2019-10-09 ENCOUNTER — Other Ambulatory Visit: Payer: Self-pay

## 2019-10-09 ENCOUNTER — Encounter: Payer: Self-pay | Admitting: Emergency Medicine

## 2019-10-09 ENCOUNTER — Ambulatory Visit
Admission: EM | Admit: 2019-10-09 | Discharge: 2019-10-09 | Disposition: A | Discharge status: Home / Self Care | Payer: Medicare PPO | Attending: Physician Assistant | Admitting: Physician Assistant

## 2019-10-09 DIAGNOSIS — M109 Gout, unspecified: Secondary | ICD-10-CM | POA: Diagnosis not present

## 2019-10-09 MED ORDER — PREDNISONE 50 MG PO TABS: 50.0000 mg | ORAL_TABLET | Freq: Every day | ORAL | 0 refills | Status: DC

## 2019-10-09 NOTE — ED Provider Notes (Signed)
[facility]    CSN: [id number] Arrival date & time: 10/09/19  1046      History   Chief Complaint Chief Complaint  Patient presents with  . Appointment    1100  . Foot Pain    HPI Ryan [name] is a 69 y.o. male.   69 year old male comes in for 3 day history of left great toe pain. Was on allopurinol daily and discontinued to switch to colchicine. Has not had improvement on colchicine. Left great toe is swollen, with redness and warmth. States now feels the pain radiating up the bottom foot. Denies fever. Denies injury.      Past Medical History:  Diagnosis Date  . Ankylosing spondylitis ([place])   . Aortic aneurysm of unspecified site, ruptured [facility])    last aortic root diameter was 3.9 cm  . Atrial fibrillation (HCC)    Post-op A fib  . Benign hematuria   . BPH (benign prostatic hyperplasia)   . Chronic airway obstruction, not elsewhere classified    DOE possibly related to this problem and some restriction from ankylosing spondylitis  . Essential hypertension, benign    controlled  . 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 Mild [name] is followed clinically and by exam over the past 2-3 years.  . Hypertension   . IBS (irritable bowel syndrome)   . Pure hypercholesterolemia   . Renal insufficiency   . Thoracic aortic aneurysm [facility])    treated with aortoplasty at the time of aortic valve replacement, [facility], 09/2009    Patient Active Problem List   Diagnosis Date Noted  . Aortic regurgitation 11/19/2016  . Tobacco abuse 01/16/2016  . SOB (shortness of breath) 11/21/2015  . S/P AVR (aortic valve replacement) 05/20/2013  . Essential hypertension, benign 05/20/2013  . Ascending aortic aneurysm ([place]) 05/20/2013  . COPD (chronic obstructive pulmonary disease) ([place]) 05/20/2013  . Pure hypercholesterolemia 05/20/2013    Past Surgical History:  Procedure Laterality Date  . AORTIC VALVE REPLACEMENT   09/21/09   With bioprosthesis at the [facility] Medications    Prior to Admission medications   Medication Sig Start Date End Date Taking? Authorizing Provider  albuterol (PROVENTIL HFA;VENTOLIN HFA) 108 (90 BASE) MCG/ACT inhaler Inhale 2 puffs into the lungs every 4 (four) hours as needed for wheezing or shortness of breath.    [provider]  allopurinol (ZYLOPRIM) 300 MG tablet Take 300 mg by mouth daily. 11/07/18   [provider]  amoxicillin (AMOXIL) 500 MG capsule Take 2,000 mg by mouth See admin instructions. 1 hour prior to dental appointment    [provider]  Ascorbic Acid (VITAMIN C) 1000 MG tablet Take 1,000 mg by mouth daily.    [provider]  Coenzyme Q10 (COQ10) 200 MG CAPS Take 200 mg by mouth daily.    [provider]  colchicine 0.6 MG tablet Take 0.6 mg by mouth as needed.    [provider]  cyclobenzaprine (FLEXERIL) 10 MG tablet Take 10 mg by mouth at bedtime as needed for muscle spasms. Patient not taking: Reported on 10/09/2019    [provider]  etanercept (ENBREL) 50 MG/ML injection Inject 50 mg into the skin once a week.    [provider]  losartan (COZAAR) 50 MG tablet TAKE ONE-HALF TABLET BY  MOUTH DAILY 12/29/18   Belva Crome, MD  meloxicam [facility]) 15  MG tablet Take 15 mg by mouth as needed. 11/07/18   [provider]  metoprolol succinate (TOPROL-XL) 50 MG 24 hr tablet TAKE 1 TABLET BY MOUTH  DAILY WITH OR IMMEDIATLEY  FOLLOWING A MEAL 02/15/19   Belva Crome, MD  Multiple Vitamin (MULTIVITAMIN) tablet Take 1 tablet by mouth daily.    [provider]  OMEGA-3 KRILL OIL PO Take 350 mg by mouth daily.    [provider]  predniSONE (DELTASONE) 50 MG tablet Take 1 tablet (50 mg total) by mouth daily with breakfast. 10/09/19   Tasia Catchings,  V, PA-C  rosuvastatin (CRESTOR) 20 MG tablet TAKE 1 TABLET BY MOUTH  DAILY 12/29/18   Belva Crome, MD   sildenafil (VIAGRA) 50 MG tablet Take 50 mg by mouth as needed for erectile dysfunction.    [provider]  Spacer/Aero-Holding Chambers (AEROCHAMBER PLUS) inhaler Use as instructed    [provider]  VYZULTA 0.024 % SOLN Place 1 drop into both eyes at bedtime. 11/28/17   [provider]    Family History Family History  Problem Relation Age of Onset  . Parkinson's disease Mother   . Cancer Mother        bladder  . Macular degeneration Mother   . Emphysema Mother   . Cirrhosis Father   . Congestive Heart Failure Father     Social History Social History   Tobacco Use  . Smoking status: Current Some Day Smoker    Packs/day: 1.00    Years: 40.00    Pack years: 40.00    Types: Cigarettes, Cigars  . Smokeless tobacco: Never Used  . Tobacco comment: has cut back on smoking, now smokes occasional cigar per pt.  Previous 40 pack/yr history  Vaping Use  . Vaping Use: Never used  Substance Use Topics  . Alcohol use: Yes    Alcohol/week: 7.0 standard drinks    Types: 7 Glasses of wine per week  . Drug use: No     Allergies   Patient has no known allergies.   Review of Systems Review of Systems  Reason unable to perform ROS: See HPI as above.     Physical Exam Triage Vital Signs ED Triage Vitals  Enc Vitals Group     BP 10/09/19 1107 111/68     Pulse Rate 10/09/19 1107 (!) 51     Resp 10/09/19 1107 18     Temp 10/09/19 1107 98.3 F (36.8 C)     Temp Source 10/09/19 1107 Oral     SpO2 10/09/19 1107 96 %     Weight --      Height --      Head Circumference --      Peak Flow --      Pain Score 10/09/19 1108 10     Pain Loc --      Pain Edu? --      Excl. in [place]? --    No data found.  Updated Vital Signs BP 111/68 (BP Location: Left Arm)   Pulse (!) 51   Temp 98.3 F (36.8 C) (Oral)   Resp 18   SpO2 96%   Physical Exam Constitutional:      General: He is not in acute distress.    Appearance: Normal appearance. He is  well-developed. He is not toxic-appearing or diaphoretic.  HENT:     Head: Normocephalic and atraumatic.  Eyes:     Conjunctiva/sclera: Conjunctivae normal.     Pupils: Pupils  are equal, round, and reactive to light.  Pulmonary:     Effort: Pulmonary effort is normal. No respiratory distress.  Musculoskeletal:     Cervical back: Normal range of motion and neck supple.     Comments: Left 1st MTP joint with swelling, erythema, warmth. No induration/fluctuance. Tender to touch/palpation. Decreased flexion of great toe, but with movement. NVI  Skin:    General: Skin is warm and dry.  Neurological:     Mental Status: He is alert and oriented to person, place, and time.      UC Treatments / Results  Labs (all labs ordered are listed, but only abnormal results are displayed) Labs Reviewed - No data to display  EKG   Radiology No results found.  Procedures Procedures (including critical care time)  Medications Ordered in UC Medications - No data to display  Initial Impression / Assessment and Plan / UC Course  I have reviewed the triage vital signs and the nursing notes.  Pertinent labs & imaging results that were available during my care of the patient were reviewed by me and considered in my medical decision making (see chart for details).    History and exam consistent with gout. No signs of cellulitis/septic joint on exam. Patient with history of CKD, will start prednisone and defer NSAIDs for now. Return precautions given.  Final Clinical Impressions(s) / UC Diagnoses   Final diagnoses:  Acute gout involving toe of left foot, unspecified cause   ED Prescriptions    Medication Sig Dispense Auth. Provider   predniSONE (DELTASONE) 50 MG tablet Take 1 tablet (50 mg total) by mouth daily with breakfast. 5 tablet Ok Edwards, PA-C     PDMP not reviewed this encounter.   Ok Edwards, PA-C 10/09/19 1154

## 2019-10-09 NOTE — ED Triage Notes (Signed)
Pt here for great left toe pain he thinks from gout with hx of same; pt was taking allopurinol until 3 days ago; pt sts he also took colchicine for first 2 days

## 2019-10-09 NOTE — Discharge Instructions (Addendum)
Prednisone as directed. Continue ice compress as needed. Follow up with podiatrist as scheduled if needed. If having spreading redness, fever, follow up for reevaluation.

## 2019-10-11 ENCOUNTER — Ambulatory Visit: Payer: Medicare Other | Admitting: Podiatry

## 2019-10-11 DIAGNOSIS — M1A09X0 Idiopathic chronic gout, multiple sites, without tophus (tophi): Secondary | ICD-10-CM | POA: Diagnosis not present

## 2019-10-14 DIAGNOSIS — M79672 Pain in left foot: Secondary | ICD-10-CM | POA: Diagnosis not present

## 2019-10-14 DIAGNOSIS — M150 Primary generalized (osteo)arthritis: Secondary | ICD-10-CM | POA: Diagnosis not present

## 2019-10-14 DIAGNOSIS — Z6827 Body mass index (BMI) 27.0-27.9, adult: Secondary | ICD-10-CM | POA: Diagnosis not present

## 2019-10-14 DIAGNOSIS — M25551 Pain in right hip: Secondary | ICD-10-CM | POA: Diagnosis not present

## 2019-10-14 DIAGNOSIS — M450 Ankylosing spondylitis of multiple sites in spine: Secondary | ICD-10-CM | POA: Diagnosis not present

## 2019-10-14 DIAGNOSIS — M8580 Other specified disorders of bone density and structure, unspecified site: Secondary | ICD-10-CM | POA: Diagnosis not present

## 2019-10-14 DIAGNOSIS — M25511 Pain in right shoulder: Secondary | ICD-10-CM | POA: Diagnosis not present

## 2019-10-14 DIAGNOSIS — M1A09X0 Idiopathic chronic gout, multiple sites, without tophus (tophi): Secondary | ICD-10-CM | POA: Diagnosis not present

## 2019-10-14 DIAGNOSIS — N1831 Chronic kidney disease, stage 3a: Secondary | ICD-10-CM | POA: Diagnosis not present

## 2019-11-16 DIAGNOSIS — H401131 Primary open-angle glaucoma, bilateral, mild stage: Secondary | ICD-10-CM | POA: Diagnosis not present

## 2019-11-22 DIAGNOSIS — D225 Melanocytic nevi of trunk: Secondary | ICD-10-CM | POA: Diagnosis not present

## 2019-11-22 DIAGNOSIS — L578 Other skin changes due to chronic exposure to nonionizing radiation: Secondary | ICD-10-CM | POA: Diagnosis not present

## 2019-11-22 DIAGNOSIS — L814 Other melanin hyperpigmentation: Secondary | ICD-10-CM | POA: Diagnosis not present

## 2019-11-22 DIAGNOSIS — L821 Other seborrheic keratosis: Secondary | ICD-10-CM | POA: Diagnosis not present

## 2020-01-31 ENCOUNTER — Other Ambulatory Visit: Payer: Medicare PPO | Admitting: *Deleted

## 2020-01-31 ENCOUNTER — Other Ambulatory Visit: Payer: Self-pay

## 2020-01-31 DIAGNOSIS — I351 Nonrheumatic aortic (valve) insufficiency: Secondary | ICD-10-CM | POA: Diagnosis not present

## 2020-01-31 DIAGNOSIS — I7121 Aneurysm of the ascending aorta, without rupture: Secondary | ICD-10-CM

## 2020-01-31 DIAGNOSIS — Z952 Presence of prosthetic heart valve: Secondary | ICD-10-CM

## 2020-01-31 DIAGNOSIS — I10 Essential (primary) hypertension: Secondary | ICD-10-CM | POA: Diagnosis not present

## 2020-01-31 DIAGNOSIS — I712 Thoracic aortic aneurysm, without rupture: Secondary | ICD-10-CM

## 2020-01-31 LAB — BASIC METABOLIC PANEL
BUN/Creatinine Ratio: 28 — ABNORMAL HIGH (ref 10–24)
BUN: 35 mg/dL — ABNORMAL HIGH (ref 8–27)
CO2: 27 mmol/L (ref 20–29)
Calcium: 9.3 mg/dL (ref 8.6–10.2)
Chloride: 106 mmol/L (ref 96–106)
Creatinine, Ser: 1.24 mg/dL (ref 0.76–1.27)
GFR calc Af Amer: 68 mL/min/{1.73_m2} (ref >59)
GFR calc non Af Amer: 59 mL/min/{1.73_m2} — ABNORMAL LOW (ref >59)
Glucose: 99 mg/dL (ref 65–99)
Potassium: 4.6 mmol/L (ref 3.5–5.2)
Sodium: 140 mmol/L (ref 134–144)

## 2020-02-01 ENCOUNTER — Other Ambulatory Visit: Payer: Self-pay | Admitting: Interventional Cardiology

## 2020-02-04 ENCOUNTER — Other Ambulatory Visit: Payer: Self-pay

## 2020-02-04 ENCOUNTER — Ambulatory Visit (INDEPENDENT_AMBULATORY_CARE_PROVIDER_SITE_OTHER)
Admission: RE | Admit: 2020-02-04 | Discharge: 2020-02-04 | Disposition: A | Discharge status: Home / Self Care | Payer: Medicare PPO | Source: Ambulatory Visit | Attending: Interventional Cardiology | Admitting: Interventional Cardiology

## 2020-02-04 DIAGNOSIS — I7121 Aneurysm of the ascending aorta, without rupture: Secondary | ICD-10-CM

## 2020-02-04 DIAGNOSIS — I2699 Other pulmonary embolism without acute cor pulmonale: Secondary | ICD-10-CM | POA: Diagnosis not present

## 2020-02-04 DIAGNOSIS — N281 Cyst of kidney, acquired: Secondary | ICD-10-CM | POA: Diagnosis not present

## 2020-02-04 DIAGNOSIS — I712 Thoracic aortic aneurysm, without rupture: Secondary | ICD-10-CM | POA: Diagnosis not present

## 2020-02-04 MED ORDER — IOHEXOL 350 MG/ML SOLN
100.0000 mL | Freq: Once | INTRAVENOUS | Status: AC | PRN
  Administered 2020-02-04: 100 mL via INTRAVENOUS

## 2020-02-07 ENCOUNTER — Encounter: Payer: Self-pay | Admitting: Pulmonary Disease

## 2020-02-07 ENCOUNTER — Ambulatory Visit: Payer: Medicare PPO | Admitting: Pulmonary Disease

## 2020-02-07 ENCOUNTER — Other Ambulatory Visit: Payer: Self-pay

## 2020-02-07 VITALS — BP 122/78 | HR 70 | Temp 97.3°F | Ht 63.0 in | Wt 145.2 lb

## 2020-02-07 DIAGNOSIS — R0602 Shortness of breath: Secondary | ICD-10-CM

## 2020-02-07 DIAGNOSIS — J984 Other disorders of lung: Secondary | ICD-10-CM | POA: Diagnosis not present

## 2020-02-07 MED ORDER — STIOLTO RESPIMAT 2.5-2.5 MCG/ACT IN AERS: 2.0000 | INHALATION_SPRAY | Freq: Every day | RESPIRATORY_TRACT | 2 refills | Status: DC

## 2020-02-07 MED ORDER — STIOLTO RESPIMAT 2.5-2.5 MCG/ACT IN AERS: 2.0000 | INHALATION_SPRAY | Freq: Every day | RESPIRATORY_TRACT | 0 refills | Status: DC

## 2020-02-07 NOTE — Progress Notes (Signed)
Patient seen in the office today and instructed on use of Stiolto Respimat.  Patient expressed understanding and demonstrated technique.  

## 2020-02-07 NOTE — Progress Notes (Signed)
Subjective:   PATIENT ID: Ryan [name], Ryan [name]   HPI  Chief Complaint  Patient presents with  . Consult    albuterol unable to use does not get to lungs, shortness of breath after exercise after swimming , wheezing, coughing.    Reason for Visit: New consult for shortness of breath  Ryan [name] is a 69 year old male active smoker who presents for shortness of breath x one year.  He is very active and enjoys hobbies like swimming and skiing. In the last year, he has noticed that he has had more shortness of breath with activity, causing him to slow down. Associated with wheezing. Wheezing occurs daily and on most nights. He has used his albuterol without relief which he feels attributed that it does not go to his lungs. Six months ago, he had episode of chest congestion that requires great effort to clear it. This happens rarely but when it does it can cause him to panic due to the onset. Denies chest pain, dizziness, chronic cough. Denies regular episodes of cough and mucous production.  Social History: Active smoker. Smoked cigarettes 1 pack x 30 years. Previously smoked cigars daily and now monthly.  I have personally reviewed patient's past medical/family/social history, allergies, current medications.  Past Medical History:  Diagnosis Date  . Ankylosing spondylitis ([place])   . Aortic aneurysm of unspecified site, ruptured [facility])    last aortic root diameter was 3.9 cm  . Atrial fibrillation (HCC)    Post-op A fib  . Benign hematuria   . BPH (benign prostatic hyperplasia)   . Chronic airway obstruction, not elsewhere classified    DOE possibly related to this problem and some restriction from ankylosing spondylitis  . Essential hypertension, benign    controlled  . 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 Mild [name] is followed clinically and by exam over the past 2-3 years.  .  Hypertension   . IBS (irritable bowel syndrome)   . Pure hypercholesterolemia   . Renal insufficiency   . Thoracic aortic aneurysm [facility])    treated with aortoplasty at the time of aortic valve replacement, [facility], 09/2009     Family History  Problem Relation Age of Onset  . Parkinson's disease Mother   . Cancer Mother        bladder  . Macular degeneration Mother   . Emphysema Mother   . Cirrhosis Father   . Congestive Heart Failure Father      Social History   Occupational History  . Not on file  Tobacco Use  . Smoking status: Current Some Day Smoker    Packs/day: 1.00    Years: 50.00    Pack years: 50.00    Types: Cigarettes, Cigars  . Smokeless tobacco: Never Used  . Tobacco comment: last puff on cigar 3 weeks ago, always smokes black and milds  Vaping Use  . Vaping Use: Never used  Substance and Sexual Activity  . Alcohol use: Yes    Alcohol/week: 7.0 standard drinks    Types: 7 Glasses of wine per week  . Drug use: No  . Sexual activity: Not on file    No Known Allergies   Outpatient Medications Prior to Visit  Medication Sig Dispense Refill  . allopurinol (ZYLOPRIM) 300 MG tablet Take 300 mg by mouth daily.    [name] Kitchen amoxicillin (AMOXIL) 500 MG capsule Take 2,000 mg  by mouth See admin instructions. 1 hour prior to dental appointment    . Ascorbic Acid (VITAMIN C) 1000 MG tablet Take 1,000 mg by mouth daily.    . Coenzyme Q10 (COQ10) 200 MG CAPS Take 200 mg by mouth daily.    . colchicine 0.6 MG tablet Take 0.6 mg by mouth as needed.    . etanercept (ENBREL) 50 MG/ML injection Inject 50 mg into the skin once a week.    . losartan (COZAAR) 50 MG tablet TAKE 1/2 TABLET EVERY DAY 45 tablet 3  . meloxicam (MOBIC) 15 MG tablet Take 15 mg by mouth as needed.    . metoprolol succinate (TOPROL-XL) 50 MG 24 hr tablet TAKE 1 TABLET BY MOUTH  DAILY WITH OR IMMEDIATLEY  FOLLOWING A MEAL 90 tablet 3  . Multiple Vitamin (MULTIVITAMIN) tablet Take 1 tablet by mouth  daily.    . OMEGA-3 KRILL OIL PO Take 350 mg by mouth daily.    . rosuvastatin (CRESTOR) 20 MG tablet TAKE 1 TABLET BY MOUTH  DAILY 90 tablet 3  . sildenafil (VIAGRA) 50 MG tablet Take 50 mg by mouth as needed for erectile dysfunction.    [name] Kitchen Spacer/Aero-Holding Chambers (AEROCHAMBER PLUS) inhaler Use as instructed    . traZODone (DESYREL) 50 MG tablet     . VYZULTA 0.024 % SOLN Place 1 drop into both eyes at bedtime.    [name] Kitchen albuterol (PROVENTIL HFA;VENTOLIN HFA) 108 (90 BASE) MCG/ACT inhaler Inhale 2 puffs into the lungs every 4 (four) hours as needed for wheezing or shortness of breath. (Patient not taking: Reported on 02/07/2020)    . cyclobenzaprine (FLEXERIL) 10 MG tablet Take 10 mg by mouth at bedtime as needed for muscle spasms. (Patient not taking: Reported on 10/09/2019)    . predniSONE (DELTASONE) 50 MG tablet Take 1 tablet (50 mg total) by mouth daily with breakfast. 5 tablet 0   No facility-administered medications prior to visit.    Review of Systems  Constitutional: Negative for chills, diaphoresis, fever, malaise/fatigue and weight loss.  HENT: Negative for congestion, ear pain and sore throat.   Respiratory: Positive for shortness of breath and wheezing. Negative for cough, hemoptysis and sputum production.   Cardiovascular: Negative for chest pain, palpitations and leg swelling.  Gastrointestinal: Negative for abdominal pain, heartburn and nausea.  Genitourinary: Negative for frequency.  Musculoskeletal: Negative for joint pain and myalgias.  Skin: Negative for itching and rash.  Neurological: Negative for dizziness, weakness and headaches.  Endo/Heme/Allergies: Does not bruise/bleed easily.  Psychiatric/Behavioral: Negative for depression. The patient is not nervous/anxious.      Objective:   Vitals:   02/07/20 0935 02/07/20 0936  BP: 122/78 122/78  Pulse:  70  Temp:  (!) 97.3 F (36.3 C)  TempSrc:  Temporal  SpO2:  96%  Weight:  145 lb 3.2 oz (65.9 kg)  Height:  5'  3" (1.6 m)   SpO2: 96 % O2 Device: None (Room air)  Physical Exam: General: Well-appearing, no acute distress HENT: [place], AT, OP clear, MMM Eyes: EOMI, no scleral icterus Respiratory: Clear to auscultation bilaterally.  No crackles, wheezing or rales Cardiovascular: RRR, -M/R/G, no JVD Extremities:-Edema,-tenderness Neuro: AAO x4, CNII-XII grossly intact Skin: Intact, no rashes or bruising Psych: Normal mood, normal affect  Data Reviewed:  Imaging: CTA 02/04/20 - No infiltrate, effusion or edema. No parenchymal abnormalities  PFT: 11/22/15 FVC 2.44 (67%) FEV1 1.93 (72%) Ratio 75  TLC 76% DLCO 70% corrected Interpretation: Borderline normal ratio. Mild restrictive defect  with reduced gas exchange.  Labs: CBC    Component Value Date/Time   WBC 8.0 11/17/2017 1032   WBC 9.4 12/21/2015 0038   RBC 4.65 11/17/2017 1032   RBC 4.45 12/21/2015 0038   HGB 15.5 11/17/2017 1032   HCT 46.0 11/17/2017 1032   PLT 199 11/17/2017 1032   MCV 99 (H) 11/17/2017 1032   MCH 33.3 (H) 11/17/2017 1032   MCH 33.3 12/21/2015 0038   MCHC 33.7 11/17/2017 1032   MCHC 33.6 12/21/2015 0038   RDW 13.9 11/17/2017 1032   Imaging, labs and test noted above have been reviewed independently by me.    Assessment & Plan:   Discussion: 69 year old male with active smoker (cigars) with mild restrictive lung defect, s/p AVR with perivalvular leak, pAF and chronic diastolic heart failure who presents with shortness of breath. Prior PFTs show normal ratio however with his restrictive defect, this may mask obstruction especially since reduced gas exchange is not expected. He has no signs of ILD and echocardiogram does not suggest underlying PH. Suspect his abnormal PFT is mixed obstructive defect with co-comitant restriction.  We discussed clinical course of COPD and the effect of smoking on the lungs. We discussed inhaler technique and medications used to treat COPD. Prefers not to use steroids which would be  appropriate as he is a GOLD Class A/B.  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 Smokes monthly while fishing. Does not express interest in quitting at this time as he has cut back significantly compared to his past.   Health Maintenance Immunization History  Administered Date(s) Administered  . Influenza, High Dose Seasonal PF 12/13/2019  . Influenza,inj,Quad PF,6+ Mos 01/15/2016   CT Lung Screen - discuss at next visit  Orders Placed This Encounter  Procedures  . Pulmonary function test    Standing Status:   Future    Standing Expiration Date:   02/06/2021    Order Specific Question:   Where should this test be performed?    Answer:   [facility]    Order Specific Question:   Full PFT: includes the following: basic spirometry, spirometry pre & post bronchodilator, diffusion capacity (DLCO), lung volumes    Answer:   FULL PFT Without spirometry post bronchodilator   Meds ordered this encounter  Medications  . Tiotropium Bromide-Olodaterol (STIOLTO RESPIMAT) 2.5-2.5 MCG/ACT AERS    Sig: Inhale 2 puffs into the lungs daily.    Dispense:  4 g    Refill:  0    Order Specific Question:   Lot Number?    Answer:   [id number]    Order Specific Question:   Expiration Date?    Answer:   10/02/2021    Order Specific Question:   Quantity    Answer:   1  . Tiotropium Bromide-Olodaterol (STIOLTO RESPIMAT) 2.5-2.5 MCG/ACT AERS    Sig: Inhale 2 puffs into the lungs daily.    Dispense:  3 each    Refill:  2    Return in about 3 months (around 05/07/2020).  [place], MD [facility] 02/07/2020 9:40 AM  Office Number [phone]

## 2020-02-07 NOTE — Patient Instructions (Addendum)
--  START Stioloto TWO puffs once a day --CONTINUE Albuterol with a SPACER before activity --Will arrange for pulmonary function tests at next visit  Follow-up with me in 3 months

## 2020-03-12 NOTE — Progress Notes (Signed)
Cardiology Office Note:    Date:  03/13/2020   ID:  Ryan [name], DOB Jul 27, 1950, MRN [id number]  PCP:  Maurice Small, MD  Cardiologist:  Sinclair Grooms, MD   Referring MD: Maurice Small, MD   Chief Complaint  Patient presents with  . Cardiac Valve Problem  . Thoracic Aortic Aneurysm    History of Present Illness:    Ryan [name] is a 70 y.o. male with a hx of  AVR(Bioprosthetic aortic valve) with perivalvular leak, paroxysmal atrial fibrillation, ascending aortic aneurysm,andchronicdiastolic HF.  Having dyspnea on exertion that he feels is pulmonary.  Has seen pulmonology, has stopped smoking, and is using an inhaler.  The breathing is starting to interfere with physical activity.  He still swims 4 times per week.  He still skis cross-country.  He denies anginal quality chest pain.  He has left lower sternal margin discomfort that is aggravated by palpation.  The discomfort is not precipitated by activity.  Past Medical History:  Diagnosis Date  . Ankylosing spondylitis ([place])   . Aortic aneurysm of unspecified site, ruptured [facility])    last aortic root diameter was 3.9 cm  . Atrial fibrillation (HCC)    Post-op A fib  . Benign hematuria   . BPH (benign prostatic hyperplasia)   . Chronic airway obstruction, not elsewhere classified    DOE possibly related to this problem and some restriction from ankylosing spondylitis  . Essential hypertension, benign    controlled  . 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 Mild [name] is followed clinically and by exam over the past 2-3 years.  . Hypertension   . IBS (irritable bowel syndrome)   . Pure hypercholesterolemia   . Renal insufficiency   . Thoracic aortic aneurysm [facility])    treated with aortoplasty at the time of aortic valve replacement, [facility], 09/2009    Past Surgical History:  Procedure Laterality Date  . AORTIC VALVE REPLACEMENT  09/21/09   With [facility]oprosthesis  [facility]    Current Medications: Current Meds  Medication Sig  . albuterol (PROVENTIL HFA;VENTOLIN HFA) 108 (90 BASE) MCG/ACT inhaler Inhale 2 puffs into the lungs every 4 (four) hours as needed for wheezing or shortness of breath.  . allopurinol (ZYLOPRIM) 300 MG tablet Take 300 mg by mouth daily.  [name] Kitchen amoxicillin (AMOXIL) 500 MG capsule Take 2,000 mg by mouth See admin instructions. 1 hour prior to dental appointment  . Ascorbic Acid (VITAMIN C) 1000 MG tablet Take 1,000 mg by mouth daily.  . Coenzyme Q10 (COQ10) 200 MG CAPS Take 200 mg by mouth daily.  . colchicine 0.6 MG tablet Take 0.6 mg by mouth as needed.  . etanercept (ENBREL) 50 MG/ML injection Inject 50 mg into the skin once a week.  . losartan (COZAAR) 50 MG tablet TAKE 1/2 TABLET EVERY DAY  . meloxicam (MOBIC) 15 MG tablet Take 15 mg by mouth as needed.  . metoprolol succinate (TOPROL-XL) 50 MG 24 hr tablet TAKE 1 TABLET BY MOUTH  DAILY WITH OR IMMEDIATLEY  FOLLOWING A MEAL  . Multiple Vitamin (MULTIVITAMIN) tablet Take 1 tablet by mouth daily.  . OMEGA-3 KRILL OIL PO Take 350 mg by mouth daily.  . rosuvastatin (CRESTOR) 20 MG tablet TAKE 1 TABLET BY MOUTH  DAILY  . sildenafil (VIAGRA) 50 MG tablet Take 50 mg by mouth as needed for erectile dysfunction.  [name] Kitchen Spacer/Aero-Holding Chambers (AEROCHAMBER PLUS) inhaler Use as instructed  .  Tiotropium Bromide-Olodaterol (STIOLTO RESPIMAT) 2.5-2.5 MCG/ACT AERS Inhale 2 puffs into the lungs daily.  . Tiotropium Bromide-Olodaterol (STIOLTO RESPIMAT) 2.5-2.5 MCG/ACT AERS Inhale 2 puffs into the lungs daily.  . traZODone (DESYREL) 50 MG tablet   . VYZULTA 0.024 % SOLN Place 1 drop into both eyes at bedtime.     Allergies:   Patient has no known allergies.   Social History   Socioeconomic History  . Marital status: Married    Spouse name: Not on file  . Number of children: Not on file  . Years of education: Not on file  . Highest education level: Not on file   Occupational History  . Not on file  Tobacco Use  . Smoking status: Former Smoker    Packs/day: 1.00    Years: 50.00    Pack years: 50.00    Types: Cigarettes, Cigars    Quit date: 01/19/2020    Years since quitting: 0.1  . Smokeless tobacco: Never Used  . Tobacco comment: last puff on cigar 3 weeks ago, always smokes black and milds  Vaping Use  . Vaping Use: Never used  Substance and Sexual Activity  . Alcohol use: Yes    Alcohol/week: 7.0 standard drinks    Types: 7 Glasses of wine per week  . Drug use: No  . Sexual activity: Not on file  Other Topics Concern  . Not on file  Social History Narrative  . Not on file   Social Determinants of Health   Financial Resource Strain: Not on file  Food Insecurity: Not on file  Transportation Needs: Not on file  Physical Activity: Not on file  Stress: Not on file  Social Connections: Not on file     Family History: The patient's family history includes Cancer in his mother; Cirrhosis in his father; Congestive Heart Failure in his father; Emphysema in his mother; Macular degeneration in his mother; Parkinson's disease in his mother.  ROS:   Please see the history of present illness.    Muscle aches and pains in most muscle groups.  Staying physically active.  Gets greater than 30 minutes of moderate activity 5 out of 7 days of the week.  All other systems reviewed and are negative.  EKGs/Labs/Other Studies Reviewed:    The following studies were reviewed today:  CHEST CT 02/2020: IMPRESSION: Vascular:  Unchanged ascending thoracic aortic aneurysm measuring up to 45 mm. Ascending thoracic aortic aneurysm. Recommend semi-annual imaging followup by CTA or MRA and referral to cardiothoracic surgery if not already obtained. This recommendation follows 2010 ACCF/AHA/AATS/ACR/ASA/SCA/SCAI/SIR/STS/SVM Guidelines for the Diagnosis and Management of Patients With Thoracic Aortic Disease. Circulation. 2010; 121ML:4928372.  Aortic aneurysm NOS (ICD10-I71.9)  Non-Vascular:  No acute intrathoracic abnormality.  2D Doppler echocardiogram October 2020 IMPRESSIONS    1. Left ventricular ejection fraction, by visual estimation, is 60 to  65%. The left ventricle has normal function. Normal left ventricular size.  There is moderately increased left ventricular hypertrophy.  2. Global right ventricle has normal systolic function.The right  ventricular size is normal.  3. Left atrial size was normal.  4. Right atrial size was normal.  5. The mitral valve is normal in structure. No evidence of mitral valve  regurgitation. No evidence of mitral stenosis.  6. The tricuspid valve is normal in structure. Tricuspid valve  regurgitation is mild.  7. Aortic valve regurgitation is mild by color flow Doppler.  8. The pulmonic valve was normal in structure. Pulmonic valve  regurgitation is trivial by  color flow Doppler.  9. Aortic dilatation noted.  10. There is moderate dilatation of the ascending aorta measuring 46 mm.  11. The inferior vena cava is normal in size with greater than 50%  respiratory variability, suggesting right atrial pressure of 3 mmHg.  12. Normal LV systolic function; moderate LVH; moderately dilated  ascending aorta (4.6 cm); s/p AVR with mean gradient of 13 mmHg and mild  AI.    EKG:  EKG sinus bradycardia 52 bpm.  Early repolarization.  Vertical axis.  Compared to the tracing performed February 08, 2019, the heart rate is slower but otherwise unchanged.  Recent Labs: 01/31/2020: BUN 35; Creatinine, Ser 1.24; Potassium 4.6; Sodium 140  Recent Lipid Panel    Component Value Date/Time   CHOL 138 11/28/2017 0917   TRIG 60 11/28/2017 0917   HDL 52 11/28/2017 0917   CHOLHDL 2.7 11/28/2017 0917   LDLCALC 74 11/28/2017 0917    Physical Exam:    VS:  Ht 5\' 2"  (1.575 m)   Wt 148 lb (67.1 kg)   SpO2 96%   BMI 27.07 kg/m     Wt Readings from Last 3 Encounters:  03/13/20 148 lb  (67.1 kg)  02/07/20 145 lb 3.2 oz (65.9 kg)  02/08/19 147 lb 12.8 oz (67 kg)     GEN: Has kyphoscoliosis.. No acute distress HEENT: Normal NECK: No JVD. LYMPHATICS: No lymphadenopathy CARDIAC: 1/6 to 2/6 decrescendo diastolic murmur.. RRR no gallop, or edema. VASCULAR:  Normal Pulses. No bruits. RESPIRATORY:  Clear to auscultation without rales, wheezing or rhonchi  ABDOMEN: Soft, non-tender, non-distended, No pulsatile mass, MUSCULOSKELETAL: No deformity  SKIN: Warm and dry NEUROLOGIC:  Alert and oriented x 3 PSYCHIATRIC:  Normal affect   ASSESSMENT:    1. S/P AVR (aortic valve replacement)   2. Aortic valve insufficiency, etiology of cardiac valve disease unspecified   3. Ascending aortic aneurysm ([place])   4. Essential hypertension, benign   5. Pure hypercholesterolemia   6. Tobacco abuse   7. Educated about COVID-19 virus infection    PLAN:    In order of problems listed above:  1. Bioprosthetic valve.  Mild aortic regurgitation is audible.  2D Doppler echocardiogram before next year's office visit. 2. Follow-up echo in 1 year. 3. Stable in size measuring 4.5 to 4.6 cm by echo and CT done most recently. 4. Excellent control on current therapy. 5. Continue Crestor 20 mg/day.  Last LDL was 78 in February 21.  Redo of labs this coming up. 6. He is stopped smoking since November. 7. Vaccinated, boosted, but starting to travel.  He is at increased risk if he gets COVID.  He understands the risk and is willing to assume the responsibility for interaction.   Medication Adjustments/Labs and Tests Ordered: Current medicines are reviewed at length with the patient today.  Concerns regarding medicines are outlined above.  Orders Placed This Encounter  Procedures  . EKG 12-Lead   No orders of the defined types were placed in this encounter.   There are no Patient Instructions on file for this visit.   Signed, Sinclair Grooms, MD  03/13/2020 9:34 AM    [place]

## 2020-03-13 ENCOUNTER — Other Ambulatory Visit: Payer: Self-pay

## 2020-03-13 ENCOUNTER — Ambulatory Visit: Payer: Medicare PPO | Admitting: Interventional Cardiology

## 2020-03-13 ENCOUNTER — Encounter: Payer: Self-pay | Admitting: Interventional Cardiology

## 2020-03-13 VITALS — BP 134/74 | HR 52 | Ht 62.0 in | Wt 148.0 lb

## 2020-03-13 DIAGNOSIS — Z7189 Other specified counseling: Secondary | ICD-10-CM | POA: Diagnosis not present

## 2020-03-13 DIAGNOSIS — E7800 Pure hypercholesterolemia, unspecified: Secondary | ICD-10-CM

## 2020-03-13 DIAGNOSIS — I10 Essential (primary) hypertension: Secondary | ICD-10-CM | POA: Diagnosis not present

## 2020-03-13 DIAGNOSIS — I351 Nonrheumatic aortic (valve) insufficiency: Secondary | ICD-10-CM | POA: Diagnosis not present

## 2020-03-13 DIAGNOSIS — Z720 Tobacco use: Secondary | ICD-10-CM | POA: Diagnosis not present

## 2020-03-13 DIAGNOSIS — I7121 Aneurysm of the ascending aorta, without rupture: Secondary | ICD-10-CM

## 2020-03-13 DIAGNOSIS — Z952 Presence of prosthetic heart valve: Secondary | ICD-10-CM | POA: Diagnosis not present

## 2020-03-13 DIAGNOSIS — I712 Thoracic aortic aneurysm, without rupture: Secondary | ICD-10-CM

## 2020-03-13 NOTE — Patient Instructions (Signed)
Medication Instructions:  Your physician recommends that you continue on your current medications as directed. Please refer to the Current Medication list given to you today.  *If you need a refill on your cardiac medications before your next appointment, please call your pharmacy*   Lab Work: None If you have labs (blood work) drawn today and your tests are completely normal, you will receive your results only by: [name] Kitchen MyChart Message (if you have MyChart) OR . A paper copy in the mail If you have any lab test that is abnormal or we need to change your treatment, we will call you to review the results.   Testing/Procedures: Your physician has requested that you have an echocardiogram 1-2 weeks prior to seeing Dr. Tamala Julian back in one year. Echocardiography is a painless test that uses sound waves to create images of your heart. It provides your doctor with information about the size and shape of your heart and how well your heart's chambers and valves are working. This procedure takes approximately one hour. There are no restrictions for this procedure.     Follow-Up: At [facility], you and your health needs are our priority.  As part of our continuing mission to provide you with exceptional heart care, we have created designated Provider Care Teams.  These Care Teams include your primary Cardiologist (physician) and Advanced Practice Providers (APPs -  Physician Assistants and Nurse Practitioners) who all work together to provide you with the care you need, when you need it.  We recommend signing up for the patient portal called "MyChart".  Sign up information is provided on this After Visit Summary.  MyChart is used to connect with patients for Virtual Visits (Telemedicine).  Patients are able to view lab/test results, encounter notes, upcoming appointments, etc.  Non-urgent messages can be sent to your provider as well.   To learn more about what you can do with MyChart, go to  [email or web address].    Your next appointment:   1 year(s)  The format for your next appointment:   In Person  Provider:   You may see Sinclair Grooms, MD or one of the following Advanced Practice Providers on your designated Care Team:    Truitt Merle, NP  Cecilie Kicks, NP  Kathyrn Drown, NP    Other Instructions

## 2020-03-16 DIAGNOSIS — N1831 Chronic kidney disease, stage 3a: Secondary | ICD-10-CM | POA: Diagnosis not present

## 2020-03-16 DIAGNOSIS — M450 Ankylosing spondylitis of multiple sites in spine: Secondary | ICD-10-CM | POA: Diagnosis not present

## 2020-03-16 DIAGNOSIS — M1A09X0 Idiopathic chronic gout, multiple sites, without tophus (tophi): Secondary | ICD-10-CM | POA: Diagnosis not present

## 2020-03-16 DIAGNOSIS — M79672 Pain in left foot: Secondary | ICD-10-CM | POA: Diagnosis not present

## 2020-03-16 DIAGNOSIS — M25511 Pain in right shoulder: Secondary | ICD-10-CM | POA: Diagnosis not present

## 2020-03-16 DIAGNOSIS — M150 Primary generalized (osteo)arthritis: Secondary | ICD-10-CM | POA: Diagnosis not present

## 2020-03-16 DIAGNOSIS — M25551 Pain in right hip: Secondary | ICD-10-CM | POA: Diagnosis not present

## 2020-03-16 DIAGNOSIS — Z6827 Body mass index (BMI) 27.0-27.9, adult: Secondary | ICD-10-CM | POA: Diagnosis not present

## 2020-03-16 DIAGNOSIS — M8580 Other specified disorders of bone density and structure, unspecified site: Secondary | ICD-10-CM | POA: Diagnosis not present

## 2020-05-02 IMAGING — CT CT ANGIO CHEST
3 of 8 series · 18 of 46 positions shown · IV contrast (iopamidol)
Comparison: 11/30/2015

CLINICAL DATA: History of aortic valve replacement and known
aneurysmal disease of the ascending thoracic aorta.

EXAM:
CT ANGIOGRAPHY CHEST WITH CONTRAST
TECHNIQUE: Multidetector CT imaging of the chest was performed using the
standard protocol during bolus administration of intravenous
contrast. Multiplanar CT image reconstructions and MIPs were
obtained to evaluate the vascular anatomy.
CONTRAST:  100mL PJS7ZO-1S7 IOPAMIDOL (PJS7ZO-1S7) INJECTION 76%

[Series 4: aorta 3.0 i31f 2 · axial · 0.67mm/px · z∈[-258,+14]mm · 14 of 105 slices shown]
[im 7/105  lung]
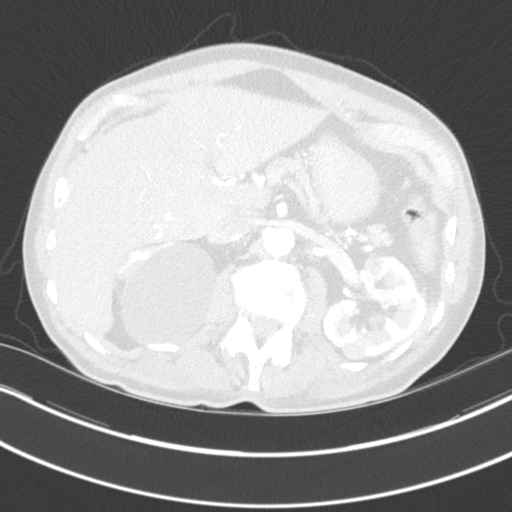
[im 14/105  soft-tissue]
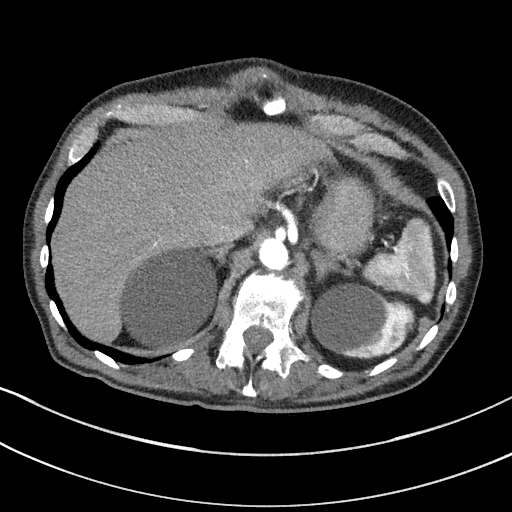
[im 21/105  lung]
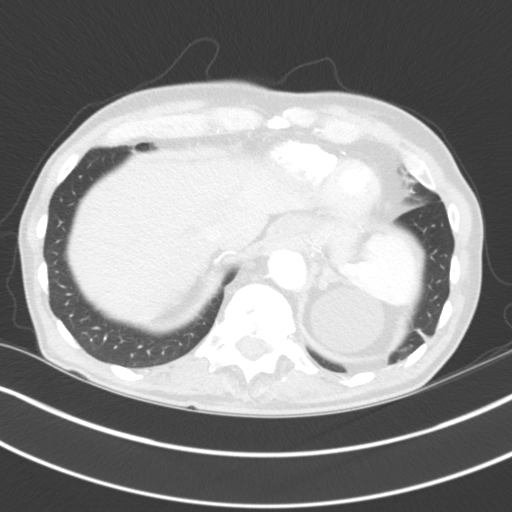
[im 28/105  soft-tissue]
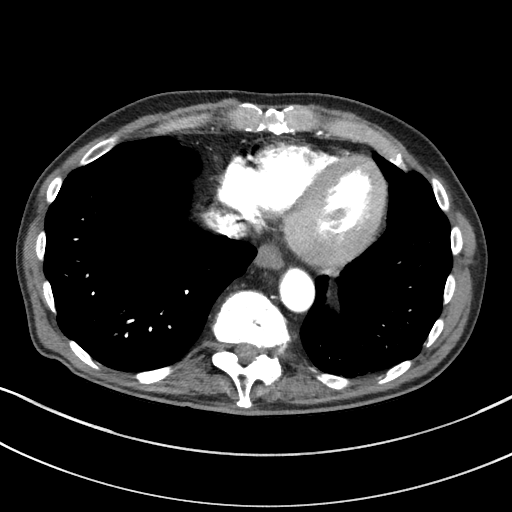
[im 35/105  lung]
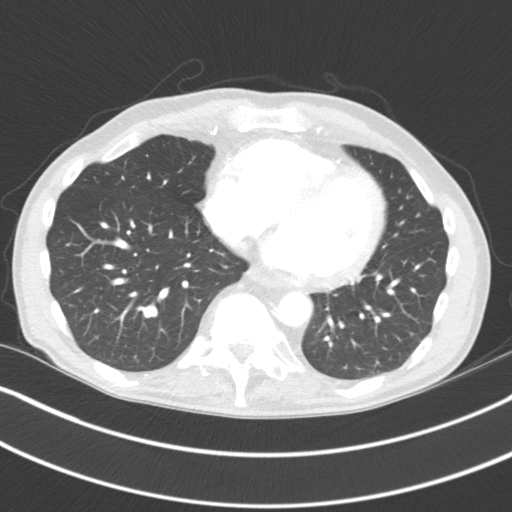
[im 42/105  soft-tissue]
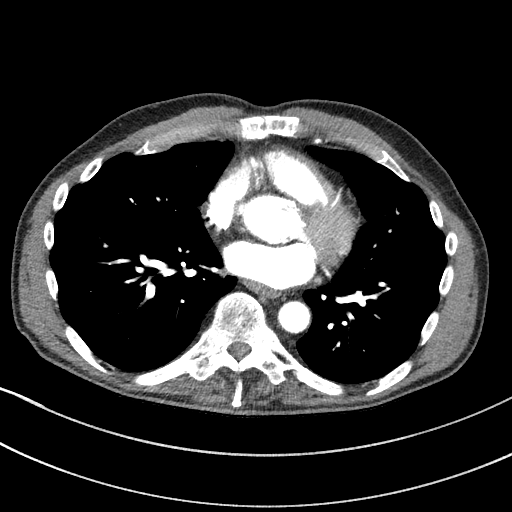
[im 49/105  lung]
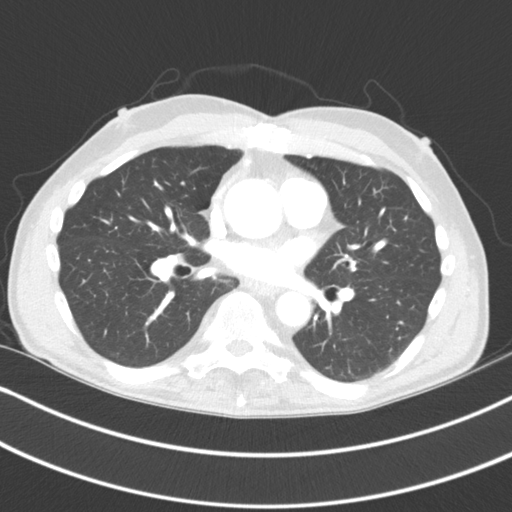
[im 56/105  soft-tissue]
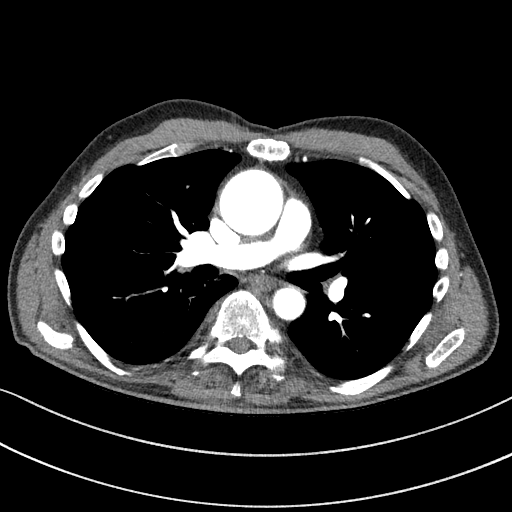
[im 63/105  lung]
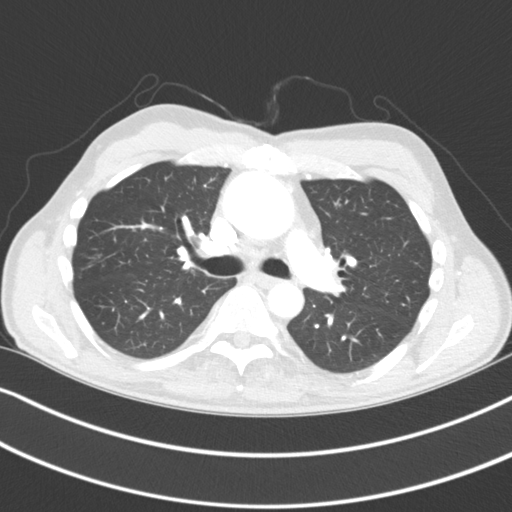
[im 70/105  soft-tissue]
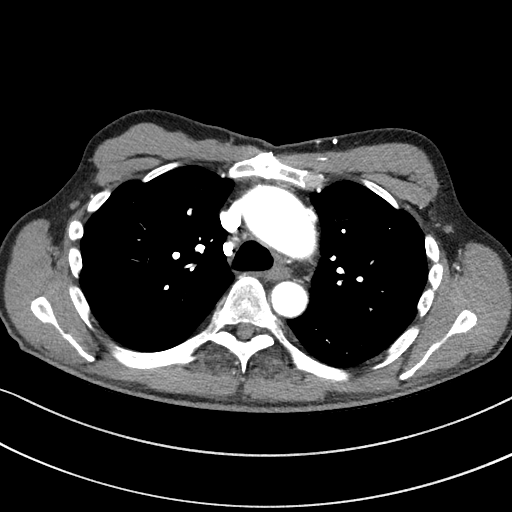
[im 77/105  lung]
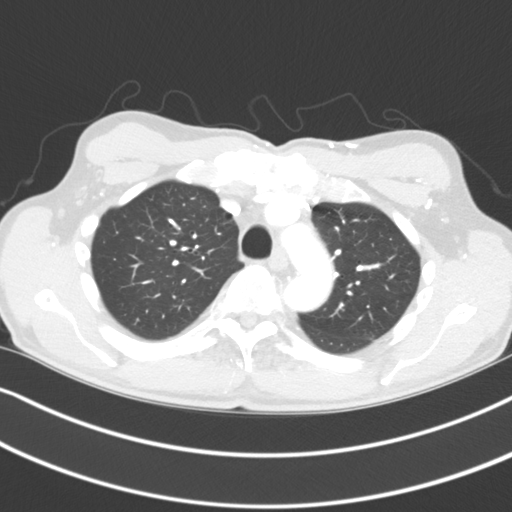
[im 84/105  soft-tissue]
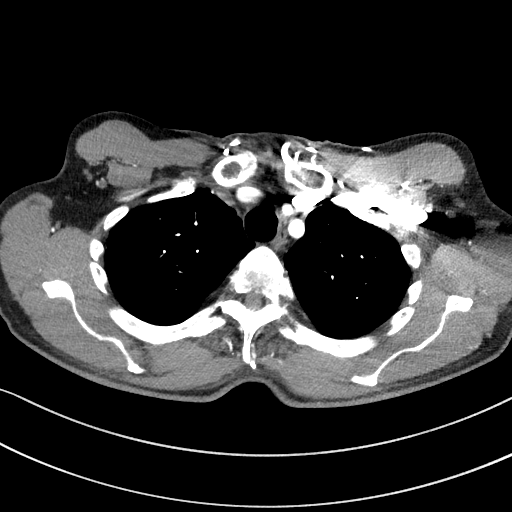
[im 91/105  lung]
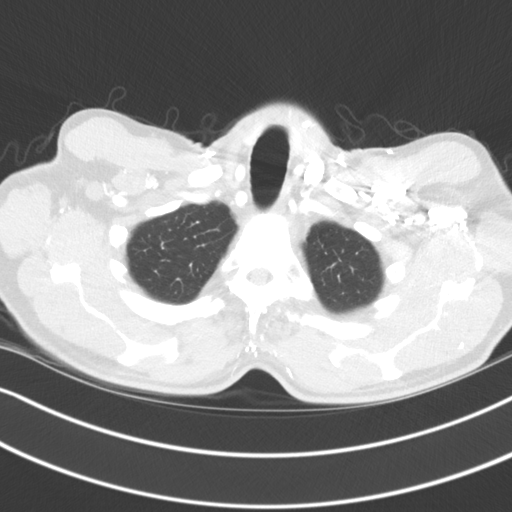
[im 98/105  soft-tissue]
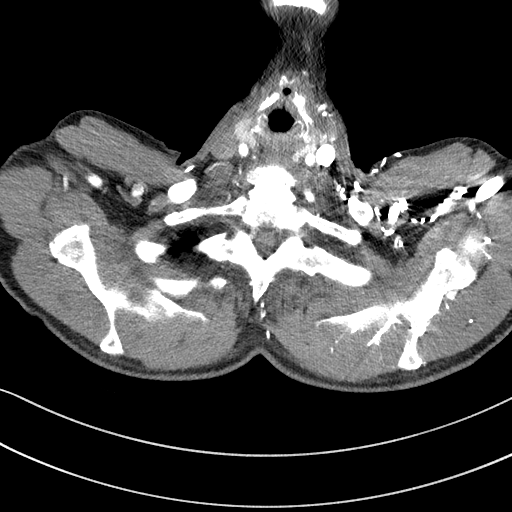

[Series 5: lung · axial · 0.67mm/px · 1 of 92 slices shown]
[im 8/92  soft-tissue]
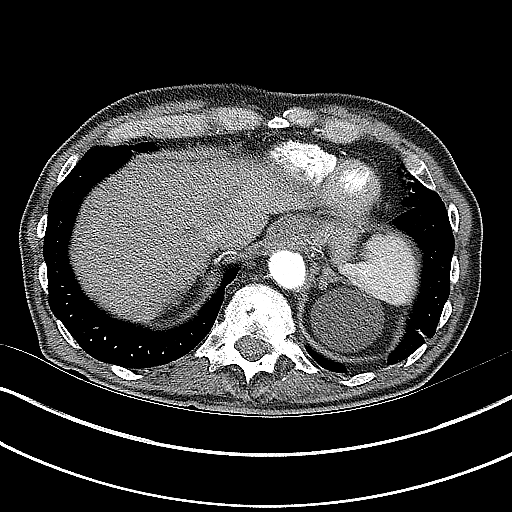

[Series 7: coronals · coronal · 0.66mm/px · 3 of 110 slices shown]
[im 28/110  soft-tissue]
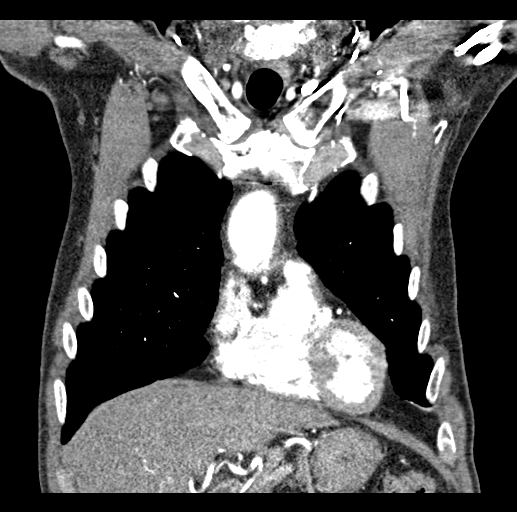
[im 55/110  soft-tissue]
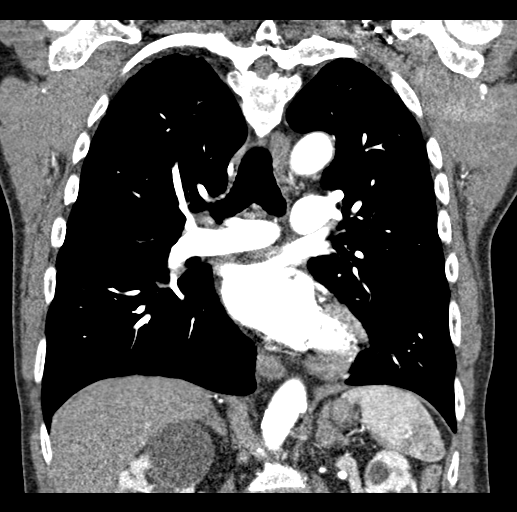
[im 82/110  soft-tissue]
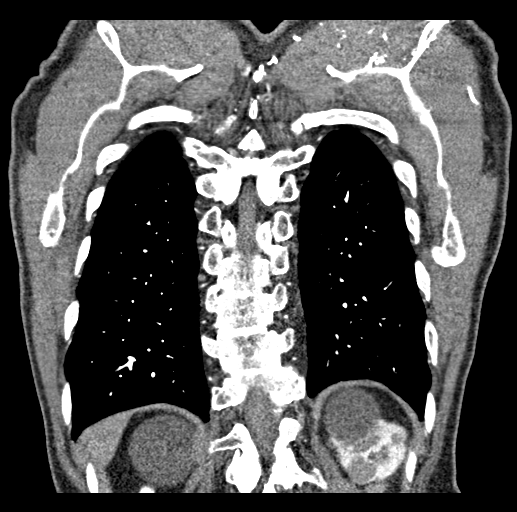

[18 of 46 positions shown; findings below may reference images not displayed]

FINDINGS: Cardiovascular: Stable appearance by CT a prosthetic aortic valve.
There is gradual dilatation of the ascending thoracic aorta reaching
maximal diameter of 4.4-4.5 cm. This is stable compared to the prior
study. The proximal arch measures 3.4 cm and the distal arch 2.5 cm.
The descending thoracic aorta measures 2.2 cm. No evidence of aortic
dissection. No significant atherosclerosis of the thoracic aorta.
Proximal great vessels show normal branching anatomy and normal
patency.

The heart size is stable. No pericardial fluid identified. Stable
mild amount of calcified plaque in the distribution of the LAD.
Central pulmonary arteries are normal in caliber.

Mediastinum/Nodes: No enlarged mediastinal, hilar, or axillary lymph
nodes. Thyroid gland, trachea, and esophagus demonstrate no
significant findings.

Lungs/Pleura: There is no evidence of pulmonary edema,
consolidation, pneumothorax, nodule or pleural fluid.

Upper Abdomen: No acute abnormality.

Musculoskeletal: Stable appearance ankylosis of the thoracic spine
without evidence of fracture. No focal bony lesions or destruction
identified.

Review of the MIP images confirms the above findings.
IMPRESSION: Stable aneurysmal disease of the ascending thoracic aorta reaching
maximal diameter of 4.4-4.5 cm. Other segments of the thoracic aorta
shows stable patency. No evidence of dissection.

Aortic aneurysm NOS ([id number]-LR2.9).

## 2020-05-10 ENCOUNTER — Other Ambulatory Visit: Payer: Self-pay | Admitting: Interventional Cardiology

## 2020-05-24 DIAGNOSIS — B078 Other viral warts: Secondary | ICD-10-CM | POA: Diagnosis not present

## 2020-05-24 DIAGNOSIS — Z85828 Personal history of other malignant neoplasm of skin: Secondary | ICD-10-CM | POA: Diagnosis not present

## 2020-06-12 DIAGNOSIS — I10 Essential (primary) hypertension: Secondary | ICD-10-CM | POA: Diagnosis not present

## 2020-06-12 DIAGNOSIS — E785 Hyperlipidemia, unspecified: Secondary | ICD-10-CM | POA: Diagnosis not present

## 2020-06-12 DIAGNOSIS — R399 Unspecified symptoms and signs involving the genitourinary system: Secondary | ICD-10-CM | POA: Diagnosis not present

## 2020-06-12 DIAGNOSIS — M109 Gout, unspecified: Secondary | ICD-10-CM | POA: Diagnosis not present

## 2020-06-14 DIAGNOSIS — J449 Chronic obstructive pulmonary disease, unspecified: Secondary | ICD-10-CM | POA: Diagnosis not present

## 2020-06-14 DIAGNOSIS — I712 Thoracic aortic aneurysm, without rupture: Secondary | ICD-10-CM | POA: Diagnosis not present

## 2020-06-14 DIAGNOSIS — Z952 Presence of prosthetic heart valve: Secondary | ICD-10-CM | POA: Diagnosis not present

## 2020-06-14 DIAGNOSIS — M109 Gout, unspecified: Secondary | ICD-10-CM | POA: Diagnosis not present

## 2020-06-14 DIAGNOSIS — F17290 Nicotine dependence, other tobacco product, uncomplicated: Secondary | ICD-10-CM | POA: Diagnosis not present

## 2020-06-14 DIAGNOSIS — E785 Hyperlipidemia, unspecified: Secondary | ICD-10-CM | POA: Diagnosis not present

## 2020-06-14 DIAGNOSIS — Z0000 Encounter for general adult medical examination without abnormal findings: Secondary | ICD-10-CM | POA: Diagnosis not present

## 2020-06-14 DIAGNOSIS — M459 Ankylosing spondylitis of unspecified sites in spine: Secondary | ICD-10-CM | POA: Diagnosis not present

## 2020-06-14 DIAGNOSIS — I10 Essential (primary) hypertension: Secondary | ICD-10-CM | POA: Diagnosis not present

## 2020-08-03 DIAGNOSIS — H9313 Tinnitus, bilateral: Secondary | ICD-10-CM | POA: Diagnosis not present

## 2020-08-03 DIAGNOSIS — H903 Sensorineural hearing loss, bilateral: Secondary | ICD-10-CM | POA: Diagnosis not present

## 2020-08-03 DIAGNOSIS — Z822 Family history of deafness and hearing loss: Secondary | ICD-10-CM | POA: Diagnosis not present

## 2020-08-03 DIAGNOSIS — Z77122 Contact with and (suspected) exposure to noise: Secondary | ICD-10-CM | POA: Diagnosis not present

## 2020-09-06 DIAGNOSIS — H903 Sensorineural hearing loss, bilateral: Secondary | ICD-10-CM | POA: Diagnosis not present

## 2020-09-13 DIAGNOSIS — M450 Ankylosing spondylitis of multiple sites in spine: Secondary | ICD-10-CM | POA: Diagnosis not present

## 2020-09-13 DIAGNOSIS — N1831 Chronic kidney disease, stage 3a: Secondary | ICD-10-CM | POA: Diagnosis not present

## 2020-09-13 DIAGNOSIS — M1A09X0 Idiopathic chronic gout, multiple sites, without tophus (tophi): Secondary | ICD-10-CM | POA: Diagnosis not present

## 2020-09-13 DIAGNOSIS — M79672 Pain in left foot: Secondary | ICD-10-CM | POA: Diagnosis not present

## 2020-09-13 DIAGNOSIS — M8580 Other specified disorders of bone density and structure, unspecified site: Secondary | ICD-10-CM | POA: Diagnosis not present

## 2020-09-13 DIAGNOSIS — M25511 Pain in right shoulder: Secondary | ICD-10-CM | POA: Diagnosis not present

## 2020-09-13 DIAGNOSIS — Z6826 Body mass index (BMI) 26.0-26.9, adult: Secondary | ICD-10-CM | POA: Diagnosis not present

## 2020-09-13 DIAGNOSIS — M25551 Pain in right hip: Secondary | ICD-10-CM | POA: Diagnosis not present

## 2020-09-13 DIAGNOSIS — M150 Primary generalized (osteo)arthritis: Secondary | ICD-10-CM | POA: Diagnosis not present

## 2020-09-15 ENCOUNTER — Other Ambulatory Visit: Payer: Self-pay | Admitting: Internal Medicine

## 2020-09-15 DIAGNOSIS — M8580 Other specified disorders of bone density and structure, unspecified site: Secondary | ICD-10-CM

## 2020-10-17 DIAGNOSIS — L578 Other skin changes due to chronic exposure to nonionizing radiation: Secondary | ICD-10-CM | POA: Diagnosis not present

## 2020-10-17 DIAGNOSIS — L821 Other seborrheic keratosis: Secondary | ICD-10-CM | POA: Diagnosis not present

## 2020-10-17 DIAGNOSIS — L570 Actinic keratosis: Secondary | ICD-10-CM | POA: Diagnosis not present

## 2020-10-17 DIAGNOSIS — D225 Melanocytic nevi of trunk: Secondary | ICD-10-CM | POA: Diagnosis not present

## 2020-10-17 DIAGNOSIS — L814 Other melanin hyperpigmentation: Secondary | ICD-10-CM | POA: Diagnosis not present

## 2020-10-17 DIAGNOSIS — B078 Other viral warts: Secondary | ICD-10-CM | POA: Diagnosis not present

## 2020-11-15 DIAGNOSIS — H2513 Age-related nuclear cataract, bilateral: Secondary | ICD-10-CM | POA: Diagnosis not present

## 2020-11-15 DIAGNOSIS — H5053 Vertical heterophoria: Secondary | ICD-10-CM | POA: Diagnosis not present

## 2020-11-15 DIAGNOSIS — H401131 Primary open-angle glaucoma, bilateral, mild stage: Secondary | ICD-10-CM | POA: Diagnosis not present

## 2021-01-08 DIAGNOSIS — M450 Ankylosing spondylitis of multiple sites in spine: Secondary | ICD-10-CM | POA: Diagnosis not present

## 2021-01-08 DIAGNOSIS — N1831 Chronic kidney disease, stage 3a: Secondary | ICD-10-CM | POA: Diagnosis not present

## 2021-01-08 DIAGNOSIS — M1A09X0 Idiopathic chronic gout, multiple sites, without tophus (tophi): Secondary | ICD-10-CM | POA: Diagnosis not present

## 2021-01-08 DIAGNOSIS — M150 Primary generalized (osteo)arthritis: Secondary | ICD-10-CM | POA: Diagnosis not present

## 2021-01-08 DIAGNOSIS — M25511 Pain in right shoulder: Secondary | ICD-10-CM | POA: Diagnosis not present

## 2021-01-08 DIAGNOSIS — M130 Polyarthritis, unspecified: Secondary | ICD-10-CM | POA: Diagnosis not present

## 2021-01-08 DIAGNOSIS — M8580 Other specified disorders of bone density and structure, unspecified site: Secondary | ICD-10-CM | POA: Diagnosis not present

## 2021-01-08 DIAGNOSIS — M79672 Pain in left foot: Secondary | ICD-10-CM | POA: Diagnosis not present

## 2021-01-08 DIAGNOSIS — Z111 Encounter for screening for respiratory tuberculosis: Secondary | ICD-10-CM | POA: Diagnosis not present

## 2021-01-08 DIAGNOSIS — M25551 Pain in right hip: Secondary | ICD-10-CM | POA: Diagnosis not present

## 2021-01-29 DIAGNOSIS — M8580 Other specified disorders of bone density and structure, unspecified site: Secondary | ICD-10-CM | POA: Diagnosis not present

## 2021-01-29 DIAGNOSIS — N1831 Chronic kidney disease, stage 3a: Secondary | ICD-10-CM | POA: Diagnosis not present

## 2021-01-29 DIAGNOSIS — M150 Primary generalized (osteo)arthritis: Secondary | ICD-10-CM | POA: Diagnosis not present

## 2021-01-29 DIAGNOSIS — Z6826 Body mass index (BMI) 26.0-26.9, adult: Secondary | ICD-10-CM | POA: Diagnosis not present

## 2021-01-29 DIAGNOSIS — M25551 Pain in right hip: Secondary | ICD-10-CM | POA: Diagnosis not present

## 2021-01-29 DIAGNOSIS — M79672 Pain in left foot: Secondary | ICD-10-CM | POA: Diagnosis not present

## 2021-01-29 DIAGNOSIS — M25511 Pain in right shoulder: Secondary | ICD-10-CM | POA: Diagnosis not present

## 2021-01-29 DIAGNOSIS — M1A09X0 Idiopathic chronic gout, multiple sites, without tophus (tophi): Secondary | ICD-10-CM | POA: Diagnosis not present

## 2021-01-29 DIAGNOSIS — M450 Ankylosing spondylitis of multiple sites in spine: Secondary | ICD-10-CM | POA: Diagnosis not present

## 2021-02-26 ENCOUNTER — Other Ambulatory Visit: Payer: Self-pay | Admitting: Interventional Cardiology

## 2021-02-27 DIAGNOSIS — R3121 Asymptomatic microscopic hematuria: Secondary | ICD-10-CM | POA: Diagnosis not present

## 2021-02-27 DIAGNOSIS — N5201 Erectile dysfunction due to arterial insufficiency: Secondary | ICD-10-CM | POA: Diagnosis not present

## 2021-02-27 DIAGNOSIS — Z125 Encounter for screening for malignant neoplasm of prostate: Secondary | ICD-10-CM | POA: Diagnosis not present

## 2021-02-27 DIAGNOSIS — R350 Frequency of micturition: Secondary | ICD-10-CM | POA: Diagnosis not present

## 2021-02-27 DIAGNOSIS — N401 Enlarged prostate with lower urinary tract symptoms: Secondary | ICD-10-CM | POA: Diagnosis not present

## 2021-03-06 ENCOUNTER — Other Ambulatory Visit: Payer: Self-pay

## 2021-03-06 ENCOUNTER — Ambulatory Visit (HOSPITAL_COMMUNITY): Payer: Medicare PPO | Attending: Cardiovascular Disease

## 2021-03-06 DIAGNOSIS — Z952 Presence of prosthetic heart valve: Secondary | ICD-10-CM | POA: Insufficient documentation

## 2021-03-06 DIAGNOSIS — I351 Nonrheumatic aortic (valve) insufficiency: Secondary | ICD-10-CM | POA: Diagnosis not present

## 2021-03-06 LAB — ECHOCARDIOGRAM COMPLETE
AV Mean grad: 12 mmHg
AV Peak grad: 21.5 mmHg
Ao pk vel: 2.32 m/s
Area-P 1/2: 2.45 cm2
P 1/2 time: 442 msec
S' Lateral: 2.6 cm

## 2021-03-11 NOTE — Progress Notes (Signed)
Cardiology Office Note:    Date:  03/13/2021   ID:  Ryan [name], DOB 11/01/1950, MRN [id number]  PCP:  Maurice Small, MD  Cardiologist:  Sinclair Grooms, MD   Referring MD: Maurice Small, MD   Chief Complaint  Patient presents with   Cardiac Valve Problem    Aortic regurgitation    History of Present Illness:    Ryan [name] is a 71 y.o. 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    Feels well.  Getting ready to go on a ski trip.  He has some limiting dyspnea.  Started smoking again.  Denies orthopnea and edema.  No palpitations.  Past Medical History:  Diagnosis Date   Ankylosing spondylitis (Ryan [name])    Aortic aneurysm of unspecified site, ruptured [facility])    last aortic root diameter was 3.9 cm   Atrial fibrillation (HCC)    Post-op A fib   Benign hematuria    BPH (benign prostatic hyperplasia)    Chronic airway obstruction, not elsewhere classified    DOE possibly related to this problem and some restriction from ankylosing spondylitis   Essential hypertension, benign    controlled   Heart valve replaced by other means 2011   s/p bioprosthetic aortic valve. Mild [name] is followed clinically and by exam over the past 2-3 years.   Hypertension    IBS (irritable bowel syndrome)    Pure hypercholesterolemia    Renal insufficiency    Thoracic aortic aneurysm    treated with aortoplasty at the time of aortic valve replacement, [facility], 09/2009    Past Surgical History:  Procedure Laterality Date   AORTIC VALVE REPLACEMENT  09/21/09   With bioprosthesis at the [facility]    Current Medications: Current Meds  Medication Sig   albuterol (PROVENTIL HFA;VENTOLIN HFA) 108 (90 BASE) MCG/ACT inhaler Inhale 2 puffs into the lungs every 4 (four) hours as needed for wheezing or shortness of breath.   allopurinol (ZYLOPRIM) 300 MG tablet  Take 300 mg by mouth daily.   amoxicillin (AMOXIL) 500 MG capsule Take 2,000 mg by mouth See admin instructions. 1 hour prior to dental appointment   Ascorbic Acid (VITAMIN C) 1000 MG tablet Take 1,000 mg by mouth daily.   Coenzyme Q10 (COQ10) 200 MG CAPS Take 200 mg by mouth daily.   colchicine 0.6 MG tablet Take 0.6 mg by mouth as needed.   etanercept (ENBREL) 50 MG/ML injection Inject 50 mg into the skin once a week.   losartan (COZAAR) 50 MG tablet TAKE 1/2 TABLET EVERY DAY   meloxicam (MOBIC) 15 MG tablet Take 15 mg by mouth as needed.   metoprolol succinate (TOPROL-XL) 50 MG 24 hr tablet TAKE 1 TABLET BY MOUTH  DAILY WITH OR IMMEDIATELY  FOLLOWING A MEAL   Multiple Vitamin (MULTIVITAMIN) tablet Take 1 tablet by mouth daily.   OMEGA-3 KRILL OIL PO Take 350 mg by mouth daily.   rosuvastatin (CRESTOR) 20 MG tablet TAKE 1 TABLET EVERY DAY   sildenafil (REVATIO) 20 MG tablet Take 20 mg by mouth as needed.   Spacer/Aero-Holding Chambers (AEROCHAMBER PLUS) inhaler Use as instructed   Tiotropium Bromide-Olodaterol (STIOLTO RESPIMAT) 2.5-2.5 MCG/ACT AERS Inhale 2 puffs into the lungs daily.   Tiotropium Bromide-Olodaterol (STIOLTO RESPIMAT) 2.5-2.5 MCG/ACT AERS Inhale 2 puffs into the lungs daily.   traZODone (DESYREL) 50 MG tablet    VYZULTA 0.024 %  SOLN Place 1 drop into both eyes at bedtime.   [DISCONTINUED] sildenafil (VIAGRA) 50 MG tablet Take 50 mg by mouth as needed for erectile dysfunction.     Allergies:   Patient has no known allergies.   Social History   Socioeconomic History   Marital status: Married    Spouse name: Not on file   Number of children: Not on file   Years of education: Not on file   Highest education level: Not on file  Occupational History   Not on file  Tobacco Use   Smoking status: Former    Packs/day: 1.00    Years: 50.00    Pack years: 50.00    Types: Cigarettes, Cigars    Quit date: 01/19/2020    Years since quitting: 1.1   Smokeless tobacco:  Never   Tobacco comments:    last puff on cigar 3 weeks ago, always smokes black and milds  Vaping Use   Vaping Use: Never used  Substance and Sexual Activity   Alcohol use: Yes    Alcohol/week: 7.0 standard drinks    Types: 7 Glasses of wine per week   Drug use: No   Sexual activity: Not on file  Other Topics Concern   Not on file  Social History Narrative   Not on file   Social Determinants of Health   Financial Resource Strain: Not on file  Food Insecurity: Not on file  Transportation Needs: Not on file  Physical Activity: Not on file  Stress: Not on file  Social Connections: Not on file     Family History: The patient's family history includes Cancer in his mother; Cirrhosis in his father; Congestive Heart Failure in his father; Emphysema in his mother; Macular degeneration in his mother; Parkinson's disease in his mother.  ROS:   Please see the history of present illness.    Joints and back give him a lot of trouble.  He has paresthesias in his feet, left foot greater than right.  All other systems reviewed and are negative.  EKGs/Labs/Other Studies Reviewed:    The following studies were reviewed today:  ECHOCARDIOGRAM 03/06/2021:  IMPRESSIONS   1. There is a bioprosthetic valve present in the aortic position.  Procedure Date: 09/21/09. Procedure location: [facility].   2. Right ventricular systolic function is normal. The right ventricular  size is normal. There is normal pulmonary artery systolic pressure.   3. Left ventricular ejection fraction, by estimation, is 65 to 70%. Left  ventricular ejection fraction by PLAX is 65 %. The left ventricle has  normal function. The left ventricle has no regional wall motion  abnormalities. Left ventricular diastolic  parameters are consistent with Grade I diastolic dysfunction (impaired  relaxation). The average left ventricular global longitudinal strain is  -19.2 %. The global longitudinal strain is normal.   4.  The mitral valve is normal in structure. Mild mitral valve  regurgitation. No evidence of mitral stenosis.   5. The inferior vena cava is normal in size with greater than 50%  respiratory variability, suggesting right atrial pressure of 3 mmHg.   6. Ascending aorta unchanged from 12/2018. Aortic dilatation noted. There  is mild dilatation of the aortic root, measuring 36 mm. There is moderate  dilatation of the ascending aorta, measuring 47 mm.   7. Right atrial size was mildly dilated.   Comparison(s): 12/28/18 EF 60-65%. Mild AI AV 81mmHg mean PG, 71mmHg peak  PG.   EKG:  EKG normal sinus rhythm, small  inferior Q waves, overall normal appearance.  Biatrial abnormality.  No change when compared to the January 2020.[name] Kitchen  Recent Labs: No results found for requested labs within last 8760 hours.  Recent Lipid Panel    Component Value Date/Time   CHOL 138 11/28/2017 0917   TRIG 60 11/28/2017 0917   HDL 52 11/28/2017 0917   CHOLHDL 2.7 11/28/2017 0917   LDLCALC 74 11/28/2017 0917    Physical Exam:    VS:  BP 110/76    Pulse 63    Ht 5\' 2"  (1.575 m)    Wt 149 lb 3.2 oz (67.7 kg)    SpO2 97%    BMI 27.29 kg/m     Wt Readings from Last 3 Encounters:  03/13/21 149 lb 3.2 oz (67.7 kg)  03/13/20 148 lb (67.1 kg)  02/07/20 145 lb 3.2 oz (65.9 kg)     GEN: Slender with limited range of motion neck and back due to Wyvonnia Dusky pelvic disease.. No acute distress HEENT: Normal NECK: No JVD. LYMPHATICS: No lymphadenopathy CARDIAC: No diastolic murmur is heard.  Soft 1/6 systolic murmur. RRR no gallop, or edema. VASCULAR:  Normal Pulses. No bruits. RESPIRATORY:  Clear to auscultation without rales, wheezing or rhonchi  ABDOMEN: Soft, non-tender, non-distended, No pulsatile mass, MUSCULOSKELETAL: No deformity  SKIN: Warm and dry NEUROLOGIC:  Alert and oriented x 3 PSYCHIATRIC:  Normal affect   ASSESSMENT:    1. S/P AVR (aortic valve replacement)   2. Aneurysm of ascending aorta without  rupture   3. Essential hypertension, benign   4. Pure hypercholesterolemia   5. Tobacco abuse    PLAN:    In order of problems listed above:  Based upon echo, there is moderate aortic regurgitation.  3 years ago it was graded as mild.  Repeat echo in 1 year.  Even if there is a valve in valve TAVR, the paravalvular leak will not be something that we could remedy. Stable aortic size.  Continue to follow.  4.7 cm in diameter Blood pressure is excellent.  Pulse pressure is normal suggesting that aortic regurgitation is not significant. Continue Crestor Discontinued smoking Bilateral lower extremity arterial Doppler to rule out PAD.   Medication Adjustments/Labs and Tests Ordered: Current medicines are reviewed at length with the patient today.  Concerns regarding medicines are outlined above.  Orders Placed This Encounter  Procedures   EKG 12-Lead   ECHOCARDIOGRAM COMPLETE   No orders of the defined types were placed in this encounter.   Patient Instructions  Medication Instructions:  Your physician recommends that you continue on your current medications as directed. Please refer to the Current Medication list given to you today.  *If you need a refill on your cardiac medications before your next appointment, please call your pharmacy*   Lab Work: None today If you have labs (blood work) drawn today and your tests are completely normal, you will receive your results only by: [place] (if you have MyChart) OR A paper copy in the mail If you have any lab test that is abnormal or we need to change your treatment, we will call you to review the results.   Testing/Procedures: Your physician has requested that you have an echocardiogram 1-2 weeks prior to follow up with Dr. Tamala Julian. Echocardiography is a painless test that uses sound waves to create images of your heart. It provides your doctor with information about the size and shape of your heart and how well your hearts  chambers and valves  are working. This procedure takes approximately one hour. There are no restrictions for this procedure.    Follow-Up: At [facility], you and your health needs are our priority.  As part of our continuing mission to provide you with exceptional heart care, we have created designated Provider Care Teams.  These Care Teams include your primary Cardiologist (physician) and Advanced Practice Providers (APPs -  Physician Assistants and Nurse Practitioners) who all work together to provide you with the care you need, when you need it.  We recommend signing up for the patient portal called "MyChart".  Sign up information is provided on this After Visit Summary.  MyChart is used to connect with patients for Virtual Visits (Telemedicine).  Patients are able to view lab/test results, encounter notes, upcoming appointments, etc.  Non-urgent messages can be sent to your provider as well.   To learn more about what you can do with MyChart, go to [email or web address].    Your next appointment:   1 year(s)  The format for your next appointment:   In Person  Provider:   Sinclair Grooms, MD        Signed, Sinclair Grooms, MD  03/13/2021 8:36 AM    [place]

## 2021-03-12 DIAGNOSIS — N200 Calculus of kidney: Secondary | ICD-10-CM | POA: Diagnosis not present

## 2021-03-12 DIAGNOSIS — R3129 Other microscopic hematuria: Secondary | ICD-10-CM | POA: Diagnosis not present

## 2021-03-12 DIAGNOSIS — R3121 Asymptomatic microscopic hematuria: Secondary | ICD-10-CM | POA: Diagnosis not present

## 2021-03-13 ENCOUNTER — Encounter: Payer: Self-pay | Admitting: Interventional Cardiology

## 2021-03-13 ENCOUNTER — Ambulatory Visit: Payer: Medicare PPO | Admitting: Interventional Cardiology

## 2021-03-13 ENCOUNTER — Other Ambulatory Visit: Payer: Self-pay

## 2021-03-13 VITALS — BP 110/76 | HR 63 | Ht 62.0 in | Wt 149.2 lb

## 2021-03-13 DIAGNOSIS — I7121 Aneurysm of the ascending aorta, without rupture: Secondary | ICD-10-CM

## 2021-03-13 DIAGNOSIS — I10 Essential (primary) hypertension: Secondary | ICD-10-CM | POA: Diagnosis not present

## 2021-03-13 DIAGNOSIS — M79604 Pain in right leg: Secondary | ICD-10-CM | POA: Diagnosis not present

## 2021-03-13 DIAGNOSIS — Z720 Tobacco use: Secondary | ICD-10-CM

## 2021-03-13 DIAGNOSIS — Z952 Presence of prosthetic heart valve: Secondary | ICD-10-CM | POA: Diagnosis not present

## 2021-03-13 DIAGNOSIS — E7800 Pure hypercholesterolemia, unspecified: Secondary | ICD-10-CM | POA: Diagnosis not present

## 2021-03-13 DIAGNOSIS — M79605 Pain in left leg: Secondary | ICD-10-CM | POA: Diagnosis not present

## 2021-03-13 NOTE — Patient Instructions (Addendum)
Medication Instructions:  Your physician recommends that you continue on your current medications as directed. Please refer to the Current Medication list given to you today.  *If you need a refill on your cardiac medications before your next appointment, please call your pharmacy*   Lab Work: None today If you have labs (blood work) drawn today and your tests are completely normal, you will receive your results only by: [place] (if you have MyChart) OR A paper copy in the mail If you have any lab test that is abnormal or we need to change your treatment, we will call you to review the results.   Testing/Procedures:  Your physician has requested that you have a lower or upper extremity arterial duplex. This test is an ultrasound of the arteries in the legs or arms. It looks at arterial blood flow in the legs and arms. Allow one hour for Lower and Upper Arterial scans. There are no restrictions or special instructions   Your physician has requested that you have an echocardiogram 1-2 weeks prior to follow up with Dr. Tamala Julian. Echocardiography is a painless test that uses sound waves to create images of your heart. It provides your doctor with information about the size and shape of your heart and how well your hearts chambers and valves are working. This procedure takes approximately one hour. There are no restrictions for this procedure.    Follow-Up: At [facility], you and your health needs are our priority.  As part of our continuing mission to provide you with exceptional heart care, we have created designated Provider Care Teams.  These Care Teams include your primary Cardiologist (physician) and Advanced Practice Providers (APPs -  Physician Assistants and Nurse Practitioners) who all work together to provide you with the care you need, when you need it.  We recommend signing up for the patient portal called "MyChart".  Sign up information is provided on this After Visit  Summary.  MyChart is used to connect with patients for Virtual Visits (Telemedicine).  Patients are able to view lab/test results, encounter notes, upcoming appointments, etc.  Non-urgent messages can be sent to your provider as well.   To learn more about what you can do with MyChart, go to [email or web address].    Your next appointment:   1 year(s)  The format for your next appointment:   In Person  Provider:   Sinclair Grooms, MD

## 2021-03-14 DIAGNOSIS — R509 Fever, unspecified: Secondary | ICD-10-CM | POA: Diagnosis not present

## 2021-03-14 DIAGNOSIS — J069 Acute upper respiratory infection, unspecified: Secondary | ICD-10-CM | POA: Diagnosis not present

## 2021-03-14 DIAGNOSIS — J101 Influenza due to other identified influenza virus with other respiratory manifestations: Secondary | ICD-10-CM | POA: Diagnosis not present

## 2021-03-14 DIAGNOSIS — Z03818 Encounter for observation for suspected exposure to other biological agents ruled out: Secondary | ICD-10-CM | POA: Diagnosis not present

## 2021-03-14 DIAGNOSIS — R059 Cough, unspecified: Secondary | ICD-10-CM | POA: Diagnosis not present

## 2021-03-15 ENCOUNTER — Other Ambulatory Visit: Payer: Medicare PPO

## 2021-03-22 ENCOUNTER — Other Ambulatory Visit: Payer: Self-pay | Admitting: Interventional Cardiology

## 2021-03-22 DIAGNOSIS — M79604 Pain in right leg: Secondary | ICD-10-CM

## 2021-03-30 ENCOUNTER — Ambulatory Visit (HOSPITAL_COMMUNITY): Payer: Medicare PPO

## 2021-04-04 DIAGNOSIS — R3121 Asymptomatic microscopic hematuria: Secondary | ICD-10-CM | POA: Diagnosis not present

## 2021-04-09 ENCOUNTER — Ambulatory Visit (HOSPITAL_COMMUNITY): Payer: Medicare PPO

## 2021-04-09 ENCOUNTER — Telehealth: Payer: Self-pay

## 2021-04-20 NOTE — Telephone Encounter (Signed)
No additional notes

## 2021-04-26 DIAGNOSIS — H6983 Other specified disorders of Eustachian tube, bilateral: Secondary | ICD-10-CM | POA: Diagnosis not present

## 2021-04-30 ENCOUNTER — Ambulatory Visit (HOSPITAL_COMMUNITY)
Admission: RE | Admit: 2021-04-30 | Discharge: 2021-04-30 | Disposition: A | Discharge status: Home / Self Care | Payer: Medicare PPO | Source: Ambulatory Visit | Attending: Cardiology | Admitting: Cardiology

## 2021-04-30 ENCOUNTER — Other Ambulatory Visit: Payer: Self-pay

## 2021-04-30 DIAGNOSIS — M79605 Pain in left leg: Secondary | ICD-10-CM | POA: Diagnosis not present

## 2021-04-30 DIAGNOSIS — M79604 Pain in right leg: Secondary | ICD-10-CM | POA: Diagnosis not present

## 2021-05-16 DIAGNOSIS — H2513 Age-related nuclear cataract, bilateral: Secondary | ICD-10-CM | POA: Diagnosis not present

## 2021-05-25 DIAGNOSIS — M25511 Pain in right shoulder: Secondary | ICD-10-CM | POA: Diagnosis not present

## 2021-05-25 DIAGNOSIS — M8580 Other specified disorders of bone density and structure, unspecified site: Secondary | ICD-10-CM | POA: Diagnosis not present

## 2021-05-25 DIAGNOSIS — M452 Ankylosing spondylitis of cervical region: Secondary | ICD-10-CM | POA: Diagnosis not present

## 2021-05-25 DIAGNOSIS — M79672 Pain in left foot: Secondary | ICD-10-CM | POA: Diagnosis not present

## 2021-05-25 DIAGNOSIS — M1991 Primary osteoarthritis, unspecified site: Secondary | ICD-10-CM | POA: Diagnosis not present

## 2021-05-25 DIAGNOSIS — N1831 Chronic kidney disease, stage 3a: Secondary | ICD-10-CM | POA: Diagnosis not present

## 2021-05-25 DIAGNOSIS — M25551 Pain in right hip: Secondary | ICD-10-CM | POA: Diagnosis not present

## 2021-05-25 DIAGNOSIS — M1A09X0 Idiopathic chronic gout, multiple sites, without tophus (tophi): Secondary | ICD-10-CM | POA: Diagnosis not present

## 2021-05-25 DIAGNOSIS — M450 Ankylosing spondylitis of multiple sites in spine: Secondary | ICD-10-CM | POA: Diagnosis not present

## 2021-05-25 DIAGNOSIS — M79605 Pain in left leg: Secondary | ICD-10-CM | POA: Diagnosis not present

## 2021-06-20 ENCOUNTER — Other Ambulatory Visit: Payer: Self-pay | Admitting: Interventional Cardiology

## 2021-06-20 DIAGNOSIS — M25552 Pain in left hip: Secondary | ICD-10-CM | POA: Diagnosis not present

## 2021-06-20 DIAGNOSIS — M79652 Pain in left thigh: Secondary | ICD-10-CM | POA: Diagnosis not present

## 2021-06-25 DIAGNOSIS — M79652 Pain in left thigh: Secondary | ICD-10-CM | POA: Diagnosis not present

## 2021-06-27 DIAGNOSIS — S76312D Strain of muscle, fascia and tendon of the posterior muscle group at thigh level, left thigh, subsequent encounter: Secondary | ICD-10-CM | POA: Diagnosis not present

## 2021-07-02 DIAGNOSIS — M79652 Pain in left thigh: Secondary | ICD-10-CM | POA: Diagnosis not present

## 2021-07-02 DIAGNOSIS — S76312D Strain of muscle, fascia and tendon of the posterior muscle group at thigh level, left thigh, subsequent encounter: Secondary | ICD-10-CM | POA: Diagnosis not present

## 2021-07-09 DIAGNOSIS — M79652 Pain in left thigh: Secondary | ICD-10-CM | POA: Diagnosis not present

## 2021-07-09 DIAGNOSIS — S76312D Strain of muscle, fascia and tendon of the posterior muscle group at thigh level, left thigh, subsequent encounter: Secondary | ICD-10-CM | POA: Diagnosis not present

## 2021-08-01 DIAGNOSIS — H2513 Age-related nuclear cataract, bilateral: Secondary | ICD-10-CM | POA: Diagnosis not present

## 2021-08-07 DIAGNOSIS — H2513 Age-related nuclear cataract, bilateral: Secondary | ICD-10-CM | POA: Diagnosis not present

## 2021-08-07 DIAGNOSIS — H25013 Cortical age-related cataract, bilateral: Secondary | ICD-10-CM | POA: Diagnosis not present

## 2021-08-07 DIAGNOSIS — H401131 Primary open-angle glaucoma, bilateral, mild stage: Secondary | ICD-10-CM | POA: Diagnosis not present

## 2021-08-07 DIAGNOSIS — H18413 Arcus senilis, bilateral: Secondary | ICD-10-CM | POA: Diagnosis not present

## 2021-08-07 DIAGNOSIS — H2511 Age-related nuclear cataract, right eye: Secondary | ICD-10-CM | POA: Diagnosis not present

## 2021-10-02 DIAGNOSIS — N1831 Chronic kidney disease, stage 3a: Secondary | ICD-10-CM | POA: Diagnosis not present

## 2021-10-02 DIAGNOSIS — M109 Gout, unspecified: Secondary | ICD-10-CM | POA: Diagnosis not present

## 2021-10-02 DIAGNOSIS — I10 Essential (primary) hypertension: Secondary | ICD-10-CM | POA: Diagnosis not present

## 2021-10-02 DIAGNOSIS — E785 Hyperlipidemia, unspecified: Secondary | ICD-10-CM | POA: Diagnosis not present

## 2021-10-04 DIAGNOSIS — Z0000 Encounter for general adult medical examination without abnormal findings: Secondary | ICD-10-CM | POA: Diagnosis not present

## 2021-10-04 DIAGNOSIS — L84 Corns and callosities: Secondary | ICD-10-CM | POA: Diagnosis not present

## 2021-10-04 DIAGNOSIS — I10 Essential (primary) hypertension: Secondary | ICD-10-CM | POA: Diagnosis not present

## 2021-10-04 DIAGNOSIS — E785 Hyperlipidemia, unspecified: Secondary | ICD-10-CM | POA: Diagnosis not present

## 2021-10-04 DIAGNOSIS — M109 Gout, unspecified: Secondary | ICD-10-CM | POA: Diagnosis not present

## 2021-10-04 DIAGNOSIS — Z23 Encounter for immunization: Secondary | ICD-10-CM | POA: Diagnosis not present

## 2021-10-04 DIAGNOSIS — Z952 Presence of prosthetic heart valve: Secondary | ICD-10-CM | POA: Diagnosis not present

## 2021-10-23 DIAGNOSIS — L578 Other skin changes due to chronic exposure to nonionizing radiation: Secondary | ICD-10-CM | POA: Diagnosis not present

## 2021-10-23 DIAGNOSIS — B078 Other viral warts: Secondary | ICD-10-CM | POA: Diagnosis not present

## 2021-10-23 DIAGNOSIS — L821 Other seborrheic keratosis: Secondary | ICD-10-CM | POA: Diagnosis not present

## 2021-10-23 DIAGNOSIS — M79671 Pain in right foot: Secondary | ICD-10-CM | POA: Diagnosis not present

## 2021-10-23 DIAGNOSIS — L814 Other melanin hyperpigmentation: Secondary | ICD-10-CM | POA: Diagnosis not present

## 2021-10-23 DIAGNOSIS — L570 Actinic keratosis: Secondary | ICD-10-CM | POA: Diagnosis not present

## 2021-10-23 DIAGNOSIS — D225 Melanocytic nevi of trunk: Secondary | ICD-10-CM | POA: Diagnosis not present

## 2021-10-23 DIAGNOSIS — D485 Neoplasm of uncertain behavior of skin: Secondary | ICD-10-CM | POA: Diagnosis not present

## 2021-11-12 DIAGNOSIS — H02881 Meibomian gland dysfunction right upper eyelid: Secondary | ICD-10-CM | POA: Diagnosis not present

## 2021-11-26 DIAGNOSIS — H2511 Age-related nuclear cataract, right eye: Secondary | ICD-10-CM | POA: Diagnosis not present

## 2021-11-27 DIAGNOSIS — H2511 Age-related nuclear cataract, right eye: Secondary | ICD-10-CM | POA: Diagnosis not present

## 2021-11-27 DIAGNOSIS — H25012 Cortical age-related cataract, left eye: Secondary | ICD-10-CM | POA: Diagnosis not present

## 2021-11-30 DIAGNOSIS — M25511 Pain in right shoulder: Secondary | ICD-10-CM | POA: Diagnosis not present

## 2021-11-30 DIAGNOSIS — M1991 Primary osteoarthritis, unspecified site: Secondary | ICD-10-CM | POA: Diagnosis not present

## 2021-11-30 DIAGNOSIS — M79605 Pain in left leg: Secondary | ICD-10-CM | POA: Diagnosis not present

## 2021-11-30 DIAGNOSIS — N1831 Chronic kidney disease, stage 3a: Secondary | ICD-10-CM | POA: Diagnosis not present

## 2021-11-30 DIAGNOSIS — M25551 Pain in right hip: Secondary | ICD-10-CM | POA: Diagnosis not present

## 2021-11-30 DIAGNOSIS — M452 Ankylosing spondylitis of cervical region: Secondary | ICD-10-CM | POA: Diagnosis not present

## 2021-11-30 DIAGNOSIS — M79672 Pain in left foot: Secondary | ICD-10-CM | POA: Diagnosis not present

## 2021-11-30 DIAGNOSIS — M1A09X0 Idiopathic chronic gout, multiple sites, without tophus (tophi): Secondary | ICD-10-CM | POA: Diagnosis not present

## 2021-11-30 DIAGNOSIS — M8580 Other specified disorders of bone density and structure, unspecified site: Secondary | ICD-10-CM | POA: Diagnosis not present

## 2021-12-04 DIAGNOSIS — Z23 Encounter for immunization: Secondary | ICD-10-CM | POA: Diagnosis not present

## 2021-12-26 DIAGNOSIS — M25511 Pain in right shoulder: Secondary | ICD-10-CM | POA: Diagnosis not present

## 2022-01-04 DIAGNOSIS — H2512 Age-related nuclear cataract, left eye: Secondary | ICD-10-CM | POA: Diagnosis not present

## 2022-01-12 DIAGNOSIS — M25511 Pain in right shoulder: Secondary | ICD-10-CM | POA: Diagnosis not present

## 2022-01-14 ENCOUNTER — Ambulatory Visit: Payer: Medicare PPO | Admitting: Podiatry

## 2022-01-14 DIAGNOSIS — B070 Plantar wart: Secondary | ICD-10-CM

## 2022-01-14 DIAGNOSIS — D492 Neoplasm of unspecified behavior of bone, soft tissue, and skin: Secondary | ICD-10-CM | POA: Diagnosis not present

## 2022-01-14 DIAGNOSIS — L84 Corns and callosities: Secondary | ICD-10-CM

## 2022-01-14 DIAGNOSIS — M21619 Bunion of unspecified foot: Secondary | ICD-10-CM

## 2022-01-14 NOTE — Progress Notes (Unsigned)
Subjective:   Patient ID: Ryan [name], male   DOB: 71 y.o.   MRN: [id number]   HPI Chief Complaint  Patient presents with   Plantar Warts    Patient came in today for the left foot plantar wart on the forefoot, Callus on the right foot 2nd toe,    71 year old male presents the office for above concerns.  States that he would like to talk about foot health.  He states that he is starting to get a bunion and callus on the right 2nd toe  Dermatologist tried to freze it then try OTC compound W. It is starting to hurt more.   No recent injuries. Review of Systems  All other systems reviewed and are negative.  Past Medical History:  Diagnosis Date   Ankylosing spondylitis ([place])    Aortic aneurysm of unspecified site, ruptured [facility])    last aortic root diameter was 3.9 cm   Atrial fibrillation (HCC)    Post-op A fib   Benign hematuria    BPH (benign prostatic hyperplasia)    Chronic airway obstruction, not elsewhere classified    DOE possibly related to this problem and some restriction from ankylosing spondylitis   Essential hypertension, benign    controlled   Heart valve replaced by other means 2011   s/p bioprosthetic aortic valve. Mild [name] is followed clinically and by exam over the past 2-3 years.   Hypertension    IBS (irritable bowel syndrome)    Pure hypercholesterolemia    Renal insufficiency    Thoracic aortic aneurysm    treated with aortoplasty at the time of aortic valve replacement, [facility], 09/2009    Past Surgical History:  Procedure Laterality Date   AORTIC VALVE REPLACEMENT  09/21/09   With bioprosthesis at the [facility]     Current Outpatient Medications:    albuterol (PROVENTIL HFA;VENTOLIN HFA) 108 (90 BASE) MCG/ACT inhaler, Inhale 2 puffs into the lungs every 4 (four) hours as needed for wheezing or shortness of breath., Disp: , Rfl:    allopurinol (ZYLOPRIM) 300 MG tablet, Take 300 mg by mouth daily., Disp: , Rfl:     amoxicillin (AMOXIL) 500 MG capsule, Take 2,000 mg by mouth See admin instructions. 1 hour prior to dental appointment, Disp: , Rfl:    Ascorbic Acid (VITAMIN C) 1000 MG tablet, Take 1,000 mg by mouth daily., Disp: , Rfl:    Coenzyme Q10 (COQ10) 200 MG CAPS, Take 200 mg by mouth daily., Disp: , Rfl:    colchicine 0.6 MG tablet, Take 0.6 mg by mouth as needed., Disp: , Rfl:    etanercept (ENBREL) 50 MG/ML injection, Inject 50 mg into the skin once a week., Disp: , Rfl:    losartan (COZAAR) 50 MG tablet, TAKE 1/2 TABLET EVERY DAY (Patient not taking: Reported on 01/14/2022), Disp: 45 tablet, Rfl: 3   meloxicam (MOBIC) 15 MG tablet, Take 15 mg by mouth as needed., Disp: , Rfl:    metoprolol succinate (TOPROL-XL) 50 MG 24 hr tablet, TAKE 1 TABLET BY MOUTH  DAILY WITH OR IMMEDIATELY  FOLLOWING A MEAL, Disp: 90 tablet, Rfl: 3   Multiple Vitamin (MULTIVITAMIN) tablet, Take 1 tablet by mouth daily., Disp: , Rfl:    OMEGA-3 KRILL OIL PO, Take 350 mg by mouth daily., Disp: , Rfl:    rosuvastatin (CRESTOR) 20 MG tablet, TAKE 1 TABLET EVERY DAY, Disp: 90 tablet, Rfl: 3   sildenafil (REVATIO) 20 MG tablet, Take 20 mg by mouth  as needed., Disp: , Rfl:    Spacer/Aero-Holding Chambers (AEROCHAMBER PLUS) inhaler, Use as instructed, Disp: , Rfl:    Tiotropium Bromide-Olodaterol (STIOLTO RESPIMAT) 2.5-2.5 MCG/ACT AERS, Inhale 2 puffs into the lungs daily., Disp: 4 g, Rfl: 0   Tiotropium Bromide-Olodaterol (STIOLTO RESPIMAT) 2.5-2.5 MCG/ACT AERS, Inhale 2 puffs into the lungs daily., Disp: 3 each, Rfl: 2   traZODone (DESYREL) 50 MG tablet, , Disp: , Rfl:    VYZULTA 0.024 % SOLN, Place 1 drop into both eyes at bedtime., Disp: , Rfl:   No Known Allergies        Objective:  Physical Exam  General: AAO x3, NAD  Dermatological: On the left foot submetatarsal areas that annular hyperkeratotic lesion with capillary budding and pinpoint bleeding upon debridement consistent with verruca.  No edema, erythema or signs  of infection.  No open lesions or foreign body.  Hyperkeratotic lesion noted on the medial aspect of the right second toe from where the bunion is putting pressure.  No ongoing ulceration drainage or signs of infection.  No open lesions otherwise.  Vascular: Dorsalis Pedis artery and Posterior Tibial artery pedal pulses are 2/4 bilateral with immedate capillary fill time. There is no pain with calf compression, swelling, warmth, erythema.   Neruologic: Grossly intact via light touch bilateral.   Musculoskeletal: Bunion noted.  No other areas of discomfort.  Gait: Unassisted, Nonantalgic.       Assessment:   Plantar wart left foot, hyperkeratotic lesion due to bunion     Plan:  -Treatment options discussed including all alternatives, risks, and complications -Etiology of symptoms were discussed -On the left sharply debrided the skin lesion, wart without any complications.  Cleaned with alcohol and pad was placed followed by salicylic acid and a bandage.  Postprocedure instructions discussed.  Monitor for any signs or symptoms of infection. -Regards the hyperkeratotic lesion I did sharply debride this today as it is minimal as a courtesy to any complications away.  Dispensed toe separator given the bunion.  Discussed shoe modifications and good arch supports.    Trula Slade DPM

## 2022-01-14 NOTE — Patient Instructions (Signed)
Look at [employer] to get shoes. I would get a shoe with a softer toe box to take pressure off of the bunion.   Keep the bandage on for 24 hours. At that time, remove and clean with soap and water. If it hurts or burns before 24 hours go ahead and remove the bandage and wash with soap and water. Keep the area clean. If there is any blistering cover with antibiotic ointment and a bandage. Monitor for any redness, drainage, or other signs of infection. Call the office if any are to occur. If you have any questions, please call the office at [phone].

## 2022-01-17 ENCOUNTER — Telehealth: Payer: Self-pay | Admitting: *Deleted

## 2022-01-17 NOTE — Telephone Encounter (Signed)
Called patient and informed that medication has been sent, verified with Ryan [name] that it was sent to [facility] for a wart cream.

## 2022-01-17 NOTE — Telephone Encounter (Signed)
Patient is calling for status of a cream that was supposed to be sent to his pharmacy on file,not there and nothing mentioned in visit summary, please advise.

## 2022-01-18 ENCOUNTER — Telehealth: Payer: Self-pay | Admitting: *Deleted

## 2022-01-18 NOTE — Telephone Encounter (Signed)
Patient called today for the wart cream prescription information,explained that a message was left that prescription has been sent to [facility] , gave him their number to contact as well.

## 2022-01-30 DIAGNOSIS — M25511 Pain in right shoulder: Secondary | ICD-10-CM | POA: Diagnosis not present

## 2022-02-11 ENCOUNTER — Ambulatory Visit: Payer: Medicare PPO | Admitting: Podiatry

## 2022-02-11 DIAGNOSIS — D2371 Other benign neoplasm of skin of right lower limb, including hip: Secondary | ICD-10-CM | POA: Diagnosis not present

## 2022-02-11 DIAGNOSIS — D492 Neoplasm of unspecified behavior of bone, soft tissue, and skin: Secondary | ICD-10-CM | POA: Diagnosis not present

## 2022-02-11 DIAGNOSIS — M13811 Other specified arthritis, right shoulder: Secondary | ICD-10-CM | POA: Diagnosis not present

## 2022-02-11 NOTE — Progress Notes (Signed)
Subjective: Chief Complaint  Patient presents with   Plantar Warts    Left foot plantar wart follow-up, patient denies any pain, patient states it seem better   70 year old male presents the office for above concerns.  He states that he is doing better but the lesion is still present.  No complications after the last treatment.  No drainage or pus.  No fevers or chills.  Objective: AAO x3, NAD DP/PT pulses palpable bilaterally, CRT less than 3 seconds Mild left foot submetatarsal hyperkeratotic lesion.  Upon debridement there is pinpoint bleeding and evidence of verruca.  It does appear to be smaller but still present.  No edema, erythema. No open lesions or pre-ulcerative lesions.  No pain with calf compression, swelling, warmth, erythema  Assessment: Verruca left foot  Plan: -All treatment options discussed with the patient including all alternatives, risks, complications.  -Sharply debrided lesion without any complications.  Cleaned skin with alcohol and Canthrone pus was applied followed by occlusive bandage.  Postprocedure instructions discussed.  Monitor for any signs or symptoms of infection. -Patient encouraged to call the office with any questions, concerns, change in symptoms.   Trula Slade DPM

## 2022-02-11 NOTE — Patient Instructions (Signed)
Take dressing off in 8 hours and wash the foot with soap and water. If it is hurting or becomes uncomfortable before the 8 hours, go ahead and remove the bandage and wash the area.  If it blisters, apply antibiotic ointment and a band-aid.  Monitor for any signs/symptoms of infection. Call the office immediately if any occur or go directly to the emergency room. Call with any questions/concerns.   

## 2022-02-26 DIAGNOSIS — M13811 Other specified arthritis, right shoulder: Secondary | ICD-10-CM | POA: Diagnosis not present

## 2022-02-27 DIAGNOSIS — Z125 Encounter for screening for malignant neoplasm of prostate: Secondary | ICD-10-CM | POA: Diagnosis not present

## 2022-02-27 DIAGNOSIS — R3911 Hesitancy of micturition: Secondary | ICD-10-CM | POA: Diagnosis not present

## 2022-02-27 DIAGNOSIS — R351 Nocturia: Secondary | ICD-10-CM | POA: Diagnosis not present

## 2022-02-27 DIAGNOSIS — N200 Calculus of kidney: Secondary | ICD-10-CM | POA: Diagnosis not present

## 2022-02-27 DIAGNOSIS — N401 Enlarged prostate with lower urinary tract symptoms: Secondary | ICD-10-CM | POA: Diagnosis not present

## 2022-02-27 DIAGNOSIS — N5201 Erectile dysfunction due to arterial insufficiency: Secondary | ICD-10-CM | POA: Diagnosis not present

## 2022-03-05 ENCOUNTER — Ambulatory Visit (HOSPITAL_COMMUNITY): Payer: Medicare PPO | Attending: Interventional Cardiology

## 2022-03-05 DIAGNOSIS — Z952 Presence of prosthetic heart valve: Secondary | ICD-10-CM | POA: Diagnosis not present

## 2022-03-05 LAB — ECHOCARDIOGRAM COMPLETE
AV Mean grad: 11.5 mmHg
AV Peak grad: 21.5 mmHg
Ao pk vel: 2.32 m/s
Area-P 1/2: 3.42 cm2
P 1/2 time: 449 msec
S' Lateral: 2.5 cm

## 2022-03-07 NOTE — Progress Notes (Signed)
Cardiology Office Note:    Date:  03/08/2022   ID:  Ryan [name], DOB 01-Jan-1951, MRN [id number]  PCP:  Maurice Small, MD  Cardiologist:  Sinclair Grooms, MD   Referring MD: Maurice Small, MD   Chief Complaint  Patient presents with   Cardiac Valve Problem    Aortic valve disease    History of Present Illness:    Ryan [name] is a 72 y.o. 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    Patient is doing well.  He had cataract surgery and has not been exercising as much.  He is still able to swim 15 laps at his own pace.  He continues to smoke cigarettes.  He has some mild dyspnea on exertion.  He denies orthopnea.  Past Medical History:  Diagnosis Date   Ankylosing spondylitis ([place])    Aortic aneurysm of unspecified site, ruptured [name]    last aortic root diameter was 3.9 cm   Atrial fibrillation ([facility])    Post-op A fib   Benign hematuria    BPH (benign prostatic hyperplasia)    Chronic airway obstruction, not elsewhere classified    DOE possibly related to this problem and some restriction from ankylosing spondylitis   Essential hypertension, benign    controlled   Heart valve replaced by other means 2011   s/p bioprosthetic aortic valve. Mild [name] is followed clinically and by exam over the past 2-3 years.   Hypertension    IBS (irritable bowel syndrome)    Pure hypercholesterolemia    Renal insufficiency    Thoracic aortic aneurysm ([place])    treated with aortoplasty at the time of aortic valve replacement, [name], 09/2009    Past Surgical History:  Procedure Laterality Date   AORTIC VALVE REPLACEMENT  09/21/09   With bioprosthesis at the [name]    Current Medications: Current Meds  Medication Sig   albuterol (PROVENTIL HFA;VENTOLIN HFA) 108 (90 BASE) MCG/ACT inhaler Inhale 2 puffs into the lungs every 4 (four) hours  as needed for wheezing or shortness of breath.   allopurinol (ZYLOPRIM) 300 MG tablet Take 300 mg by mouth daily.   amoxicillin (AMOXIL) 500 MG capsule Take 2,000 mg by mouth See admin instructions. 1 hour prior to dental appointment   Ascorbic Acid (VITAMIN C) 1000 MG tablet Take 1,000 mg by mouth daily.   Coenzyme Q10 (COQ10) 200 MG CAPS Take 200 mg by mouth daily.   colchicine 0.6 MG tablet Take 0.6 mg by mouth as needed.   HUMIRA PEN 40 MG/0.4ML PNKT Inject 40 mg as directed 2 (two) times a week.   metoprolol succinate (TOPROL-XL) 50 MG 24 hr tablet TAKE 1 TABLET BY MOUTH  DAILY WITH OR IMMEDIATELY  FOLLOWING A MEAL   Multiple Vitamin (MULTIVITAMIN) tablet Take 1 tablet by mouth daily.   OMEGA-3 KRILL OIL PO Take 350 mg by mouth daily.   predniSONE (DELTASONE) 10 MG tablet Take 10 mg by mouth as needed.   rosuvastatin (CRESTOR) 20 MG tablet TAKE 1 TABLET EVERY DAY   sildenafil (REVATIO) 20 MG tablet Take 20 mg by mouth as needed.   Spacer/Aero-Holding Chambers (AEROCHAMBER PLUS) inhaler Use as instructed   tamsulosin (FLOMAX) 0.4 MG CAPS capsule Take 0.4 mg by mouth 2 (two) times daily.   Tiotropium Bromide-Olodaterol (STIOLTO RESPIMAT) 2.5-2.5 MCG/ACT AERS Inhale 2 puffs into the lungs daily. (Patient taking  differently: Inhale 2 puffs into the lungs as needed.)   traZODone (DESYREL) 50 MG tablet    VYZULTA 0.024 % SOLN Place 1 drop into both eyes at bedtime.     Allergies:   Patient has no known allergies.   Social History   Socioeconomic History   Marital status: Married    Spouse name: Not on file   Number of children: Not on file   Years of education: Not on file   Highest education level: Not on file  Occupational History   Not on file  Tobacco Use   Smoking status: Former    Packs/day: 1.00    Years: 50.00    Total pack years: 50.00    Types: Cigarettes, Cigars    Quit date: 01/19/2020    Years since quitting: 2.1   Smokeless tobacco: Never   Tobacco comments:     last puff on cigar 3 weeks ago, always smokes black and milds  Vaping Use   Vaping Use: Never used  Substance and Sexual Activity   Alcohol use: Yes    Alcohol/week: 7.0 standard drinks of alcohol    Types: 7 Glasses of wine per week   Drug use: No   Sexual activity: Not on file  Other Topics Concern   Not on file  Social History Narrative   Not on file   Social Determinants of Health   Financial Resource Strain: Not on file  Food Insecurity: Not on file  Transportation Needs: Not on file  Physical Activity: Not on file  Stress: Not on file  Social Connections: Not on file     Family History: The patient's family history includes Cancer in his mother; Cirrhosis in his father; Congestive Heart Failure in his father; Emphysema in his mother; Macular degeneration in his mother; Parkinson's disease in his mother.  ROS:   Please see the history of present illness.    He denies chills, fever, and cardiac complaints.  All other systems reviewed and are negative.  EKGs/Labs/Other Studies Reviewed:    The following studies were reviewed today:  2 D Doppler ECHOCARDIOGRAM 03/05/2022: IMPRESSIONS   1. Left ventricular ejection fraction, by estimation, is 60 to 65%. The  left ventricle has normal function. The left ventricle has no regional  wall motion abnormalities. Left ventricular diastolic parameters are  consistent with Grade I diastolic  dysfunction (impaired relaxation). Elevated left ventricular end-diastolic  pressure. The average left ventricular global longitudinal strain is -17.4  %. The global longitudinal strain is normal.   2. Right ventricular systolic function is normal. The right ventricular  size is normal. There is normal pulmonary artery systolic pressure.   3. The mitral valve is normal in structure. No evidence of mitral valve  regurgitation. No evidence of mitral stenosis.   4. The aortic valve has been repaired/replaced. There is a bioprosthetic  valve  present in the aortic position. Procedure Date: 09/21/2009.      There is moderate calcification of the aortic valve bioprosthesis.  There is moderate thickening of the aortic valve bioprosthesis.  Perivalvular Aortic valve regurgitation is mild to moderate. No aortic  stenosis is present. There Aortic regurgitation  PHT measures 449 msec. Aortic valve mean gradient measures 11.5 mmHg.  Aortic valve Vmax measures 2.32 m/s.   5. Aortic dilatation noted. There is moderate dilatation of the ascending  aorta, measuring 45 mm.   6. The inferior vena cava is normal in size with greater than 50%  respiratory variability, suggesting right  atrial pressure of 3 mmHg.   CT Chest 2021: IMPRESSION: Vascular:   Unchanged ascending thoracic aortic aneurysm measuring up to 45 mm. Ascending thoracic aortic aneurysm. Recommend semi-annual imaging followup by CTA or MRA and referral to cardiothoracic surgery if not already obtained. This recommendation follows 2010 ACCF/AHA/AATS/ACR/ASA/SCA/SCAI/SIR/STS/SVM Guidelines for the Diagnosis and Management of Patients With Thoracic Aortic Disease. Circulation. 2010; 121: Y637-C588. Aortic aneurysm NOS (ICD10-I71.9)  EKG:  EKG normal sinus rhythm, early repolarization, atrial abnormality, PVC, otherwise normal.  Recent Labs: No results found for requested labs within last 365 days.  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 No acute  distress HEENT: Normal NECK: No JVD. LYMPHATICS: No lymphadenopathy CARDIAC: 1/6 systolic right upper sternal and no audible diastolic murmur that I can hear today. RRR no gallop, or edema. VASCULAR:  Normal Pulses. No bruits. RESPIRATORY:  Clear to auscultation without rales, wheezing or rhonchi  ABDOMEN: Soft, non-tender, non-distended, No pulsatile mass, MUSCULOSKELETAL: No deformity  SKIN: Warm and dry NEUROLOGIC:  Alert and oriented x 3 PSYCHIATRIC:  Normal affect   ASSESSMENT:    1. Aortic valve insufficiency, etiology of cardiac valve disease unspecified   2. S/P AVR (aortic valve replacement)   3. Aneurysm of ascending aorta without rupture ([place])   4. Essential hypertension, benign   5. Pure hypercholesterolemia   6. Tobacco abuse    PLAN:    In order of problems listed above:  Stable c/w prior study.  No progression of LV enlargement.  Perivalvular leak is mild to moderate which is unchanged over years.  I would like for him to be followed up by Dr. Estelle June. Developing evidence of valve degeneration. Needs CTA to accurately assess size.  This will be done before his office visit with Dr. Burt Knack in the summer. Blood pressure is stable on current therapy.  He stopped taking losartan because it made his diastolic blood pressures less than 60 mmHg.  He was asymptomatic.   Continue Crestor Spent 5 minutes discussing smoking cessation.  He made a commitment today that he would stop smoking.  He has done it in the past.  6-monthfollow-up to establish with one of the structural heart disease colleagues.  CT angio of aorta 1 month prior to visit with new cardiologist.   Medication Adjustments/Labs and Tests Ordered: Current medicines are reviewed at length with the patient today.  Concerns regarding medicines are outlined above.  Orders Placed This Encounter  Procedures   EKG 12-Lead   No orders of the defined types were placed in this  encounter.   Patient Instructions  Medication Instructions:  Your physician recommends that you continue on your current medications as directed. Please refer to the Current Medication list given to you today.  *If you need a refill on your  cardiac medications before your next appointment, please call your pharmacy*  Lab Work: NONE  Testing/Procedures: Your physician has requested that you have a CT angio of the chest performed in June/July 2024 to follow-up on the measurement of your aorta.  Follow-Up: At [facility], you and your health needs are our priority.  As part of our continuing mission to provide you with exceptional heart care, we have created designated Provider Care Teams.  These Care Teams include your primary Cardiologist (physician) and Advanced Practice Providers (APPs -  Physician Assistants and Nurse Practitioners) who all work together to provide you with the care you need, when you need it.  Your next appointment:   6-9 month(s)  The format for your next appointment:   In Person  Provider:   Sherren Mocha, MD  Important Information About Sugar         Signed, Sinclair Grooms, MD  03/08/2022 10:40 AM    [place]

## 2022-03-08 ENCOUNTER — Ambulatory Visit: Payer: Medicare PPO | Attending: Interventional Cardiology | Admitting: Interventional Cardiology

## 2022-03-08 ENCOUNTER — Encounter: Payer: Self-pay | Admitting: Interventional Cardiology

## 2022-03-08 VITALS — BP 112/72 | HR 68 | Ht 63.0 in | Wt 144.4 lb

## 2022-03-08 DIAGNOSIS — Z720 Tobacco use: Secondary | ICD-10-CM | POA: Diagnosis not present

## 2022-03-08 DIAGNOSIS — I351 Nonrheumatic aortic (valve) insufficiency: Secondary | ICD-10-CM | POA: Diagnosis not present

## 2022-03-08 DIAGNOSIS — Z952 Presence of prosthetic heart valve: Secondary | ICD-10-CM | POA: Diagnosis not present

## 2022-03-08 DIAGNOSIS — E7800 Pure hypercholesterolemia, unspecified: Secondary | ICD-10-CM

## 2022-03-08 DIAGNOSIS — I7121 Aneurysm of the ascending aorta, without rupture: Secondary | ICD-10-CM | POA: Diagnosis not present

## 2022-03-08 DIAGNOSIS — I10 Essential (primary) hypertension: Secondary | ICD-10-CM

## 2022-03-08 NOTE — Patient Instructions (Addendum)
Medication Instructions:  Your physician recommends that you continue on your current medications as directed. Please refer to the Current Medication list given to you today.  *If you need a refill on your cardiac medications before your next appointment, please call your pharmacy*  Lab Work: NONE  Testing/Procedures: Your physician has requested that you have a CT angio of the chest performed in June/July 2024 to follow-up on the measurement of your aorta.  Follow-Up: At [facility], you and your health needs are our priority.  As part of our continuing mission to provide you with exceptional heart care, we have created designated Provider Care Teams.  These Care Teams include your primary Cardiologist (physician) and Advanced Practice Providers (APPs -  Physician Assistants and Nurse Practitioners) who all work together to provide you with the care you need, when you need it.  Your next appointment:   6-9 month(s)  The format for your next appointment:   In Person  Provider:   Sherren Mocha, MD  Important Information About Sugar

## 2022-03-11 ENCOUNTER — Ambulatory Visit: Payer: Medicare PPO | Admitting: Podiatry

## 2022-03-11 DIAGNOSIS — D492 Neoplasm of unspecified behavior of bone, soft tissue, and skin: Secondary | ICD-10-CM

## 2022-03-11 DIAGNOSIS — D2372 Other benign neoplasm of skin of left lower limb, including hip: Secondary | ICD-10-CM

## 2022-03-11 NOTE — Patient Instructions (Signed)
Take dressing off in 8 hours and wash the foot with soap and water. If it is hurting or becomes uncomfortable before the 8 hours, go ahead and remove the bandage and wash the area.  If it blisters, apply antibiotic ointment and a band-aid.  Monitor for any signs/symptoms of infection. Call the office immediately if any occur or go directly to the emergency room. Call with any questions/concerns.   

## 2022-03-11 NOTE — Progress Notes (Unsigned)
Subjective: Chief Complaint  Patient presents with   Foot Problem    Follow up on wart on bottom of left foot near the ball of foot, wart still hanging around    Denies any systemic complaints such as fevers, chills, nausea, vomiting. No acute changes since last appointment, and no other complaints at this time.   Objective: AAO x3, NAD DP/PT pulses palpable bilaterally, CRT less than 3 seconds Protective sensation intact with Simms Weinstein monofilament, vibratory sensation intact, Achilles tendon reflex intact No areas of pinpoint bony tenderness or pain with vibratory sensation. MMT 5/5, ROM WNL. No edema, erythema, increase in warmth to bilateral lower extremities.  No open lesions or pre-ulcerative lesions.  No pain with calf compression, swelling, warmth, erythema  Assessment:  Plan: -All treatment options discussed with the patient including all alternatives, risks, complications.   -Patient encouraged to call the office with any questions, concerns, change in symptoms.

## 2022-03-14 ENCOUNTER — Other Ambulatory Visit (HOSPITAL_COMMUNITY): Payer: Medicare PPO

## 2022-03-15 ENCOUNTER — Other Ambulatory Visit: Payer: Self-pay | Admitting: Interventional Cardiology

## 2022-03-25 DIAGNOSIS — J449 Chronic obstructive pulmonary disease, unspecified: Secondary | ICD-10-CM | POA: Diagnosis not present

## 2022-03-25 DIAGNOSIS — F17200 Nicotine dependence, unspecified, uncomplicated: Secondary | ICD-10-CM | POA: Diagnosis not present

## 2022-03-25 DIAGNOSIS — N1831 Chronic kidney disease, stage 3a: Secondary | ICD-10-CM | POA: Diagnosis not present

## 2022-03-25 DIAGNOSIS — Z122 Encounter for screening for malignant neoplasm of respiratory organs: Secondary | ICD-10-CM | POA: Diagnosis not present

## 2022-03-25 DIAGNOSIS — M459 Ankylosing spondylitis of unspecified sites in spine: Secondary | ICD-10-CM | POA: Diagnosis not present

## 2022-03-25 DIAGNOSIS — M109 Gout, unspecified: Secondary | ICD-10-CM | POA: Diagnosis not present

## 2022-03-25 DIAGNOSIS — I10 Essential (primary) hypertension: Secondary | ICD-10-CM | POA: Diagnosis not present

## 2022-04-15 ENCOUNTER — Ambulatory Visit: Payer: Medicare PPO | Admitting: Podiatry

## 2022-04-15 DIAGNOSIS — D2372 Other benign neoplasm of skin of left lower limb, including hip: Secondary | ICD-10-CM | POA: Diagnosis not present

## 2022-04-15 NOTE — Patient Instructions (Signed)
Take dressing off in 8 hours and wash the foot with soap and water. If it is hurting or becomes uncomfortable before the 8 hours, go ahead and remove the bandage and wash the area.  If it blisters, apply antibiotic ointment and a band-aid.  Monitor for any signs/symptoms of infection. Call the office immediately if any occur or go directly to the emergency room. Call with any questions/concerns.  --  I would look for a good stability shoe with arch support for your flatfoot. You also need a toe box that is softer to accommodate the bunion/toes.

## 2022-04-15 NOTE — Progress Notes (Unsigned)
Subjective: Chief Complaint  Patient presents with   Follow-up    Left foot, 4 week follow up on wart, near ball of foot      72 year old male presents the office with above concerns.  Skin lesion is getting smaller but still present.  No swelling redness or any drainage.  He also did get new shoes which seems to rubbing the top of his foot.     Objective: AAO x3, NAD DP/PT pulses palpable bilaterally, CRT less than 3 seconds Hyperkeratotic lesion left foot submetatarsal 1 upon debridement there is still pinpoint bleeding and evidence of verruca.  Does appear to be smaller today but is still present.  No new lesions.  There is no edema, erythema or signs of infection.  No new lesions present. No pain with calf compression, swelling, warmth, erythema  Assessment: Verruca left foot  Plan: -All treatment options discussed with the patient including all alternatives, risks, complications.  -Sharply debrided the lesion with any complications.  Cleaned skin with alcohol.  Cantharone was applied followed by an occlusive bandage.  Postprocedure instructions discussed.  Tolerated well. -He has new SAS shoes which seem to be good for him but having the orthotics inside the shoe is to big.  Recommend he go back to the insert that came inside the shoe originally to see if this will help. -Patient encouraged to call the office with any questions, concerns, change in symptoms.   Trula Slade DPM

## 2022-04-17 DIAGNOSIS — H52223 Regular astigmatism, bilateral: Secondary | ICD-10-CM | POA: Diagnosis not present

## 2022-04-17 DIAGNOSIS — H5053 Vertical heterophoria: Secondary | ICD-10-CM | POA: Diagnosis not present

## 2022-04-17 DIAGNOSIS — H401131 Primary open-angle glaucoma, bilateral, mild stage: Secondary | ICD-10-CM | POA: Diagnosis not present

## 2022-05-10 ENCOUNTER — Telehealth: Payer: Self-pay | Admitting: Cardiovascular Disease

## 2022-05-10 NOTE — Telephone Encounter (Signed)
Pt called in to get scheduled w/ Dr. Burt Knack. Once scheduled he mentioned his CTA scheduled for 6/21. He says that it was approved for the first of the year, but he states not June and just wants to ensure it is approved before his appt. He also wants to make sure Dr. Burt Knack is ok with there being a 2 mo gap between the CTA and his appt w/ Dr. Burt Knack.

## 2022-05-14 DIAGNOSIS — M25551 Pain in right hip: Secondary | ICD-10-CM | POA: Diagnosis not present

## 2022-05-14 DIAGNOSIS — M79672 Pain in left foot: Secondary | ICD-10-CM | POA: Diagnosis not present

## 2022-05-14 DIAGNOSIS — M1A09X0 Idiopathic chronic gout, multiple sites, without tophus (tophi): Secondary | ICD-10-CM | POA: Diagnosis not present

## 2022-05-14 DIAGNOSIS — M79605 Pain in left leg: Secondary | ICD-10-CM | POA: Diagnosis not present

## 2022-05-14 DIAGNOSIS — N1831 Chronic kidney disease, stage 3a: Secondary | ICD-10-CM | POA: Diagnosis not present

## 2022-05-14 DIAGNOSIS — M8580 Other specified disorders of bone density and structure, unspecified site: Secondary | ICD-10-CM | POA: Diagnosis not present

## 2022-05-14 DIAGNOSIS — M452 Ankylosing spondylitis of cervical region: Secondary | ICD-10-CM | POA: Diagnosis not present

## 2022-05-14 DIAGNOSIS — M1991 Primary osteoarthritis, unspecified site: Secondary | ICD-10-CM | POA: Diagnosis not present

## 2022-05-14 DIAGNOSIS — M25511 Pain in right shoulder: Secondary | ICD-10-CM | POA: Diagnosis not present

## 2022-05-14 NOTE — Telephone Encounter (Signed)
Called patient, but no answer. Left detailed message letting him know that the two months between the CT and Dr Antionette Char appt is perfectly fine. Advised that we'd still result the CT and place any necessary orders and contact him with recommendations at that time, then still see him in August as scheduled.

## 2022-05-16 ENCOUNTER — Ambulatory Visit: Payer: Medicare PPO | Admitting: Podiatry

## 2022-05-21 ENCOUNTER — Ambulatory Visit: Payer: Medicare PPO | Admitting: Podiatry

## 2022-05-21 DIAGNOSIS — D2372 Other benign neoplasm of skin of left lower limb, including hip: Secondary | ICD-10-CM

## 2022-05-21 MED ORDER — FLUOROURACIL 5 % EX CREA: TOPICAL_CREAM | Freq: Two times a day (BID) | CUTANEOUS | 0 refills | Status: DC

## 2022-05-21 NOTE — Patient Instructions (Signed)
Take dressing off in 8 hours and wash the foot with soap and water. If it is hurting or becomes uncomfortable before the 8 hours, go ahead and remove the bandage and wash the area.  If it blisters, apply antibiotic ointment and a band-aid.  Monitor for any signs/symptoms of infection. Call the office immediately if any occur or go directly to the emergency room. Call with any questions/concerns.   

## 2022-05-21 NOTE — Progress Notes (Signed)
Subjective: Chief Complaint  Patient presents with   Plantar Warts    Left foot skin neoplasm, on the forefoot, rate of pain 2 out of 10, when running     72 year old male presents the office with above concerns.  States the lesion getting smaller.  He has been using a compound cream as well but still remains.  No swelling redness or drainage.    Objective: AAO x3, NAD DP/PT pulses palpable bilaterally, CRT less than 3 seconds Hyperkeratotic lesion left foot submetatarsal 1 upon debridement there is still pinpoint bleeding and evidence of verruca.  Patient is smaller and more superficial but still remains.  No surrounding erythema, ascending cellulitis.  No fluctuation or crepitation there is no malodor. No pain with calf compression, swelling, warmth, erythema  Assessment: Verruca left foot  Plan: -All treatment options discussed with the patient including all alternatives, risks, complications.  -Sharply debrided the lesion with any complications.  Cleaned skin with alcohol.  Cantharone was applied followed by an occlusive bandage.  Postprocedure instructions discussed.  Tolerated well. -Prescribed Efudex cream. -Discussed surgical excision if needed. -Patient encouraged to call the office with any questions, concerns, change in symptoms.   Trula Slade DPM

## 2022-06-10 ENCOUNTER — Ambulatory Visit: Payer: Medicare PPO | Admitting: Podiatry

## 2022-06-10 DIAGNOSIS — D2372 Other benign neoplasm of skin of left lower limb, including hip: Secondary | ICD-10-CM

## 2022-06-10 NOTE — Progress Notes (Signed)
Subjective: Chief Complaint  Patient presents with   Plantar Warts    72 year old male presents the office with above concerns.  States that is getting smaller and not as painful.  No swelling or drainage.  No other lesions.    Objective: AAO x3, NAD DP/PT pulses palpable bilaterally, CRT less than 3 seconds Hyperkeratotic lesion left foot submetatarsal and upon debridement there is still pinpoint bleeding and evidence of verruca.  Patient is smaller and more superficial but still remains.  No surrounding erythema, ascending cellulitis.  No fluctuation or crepitation there is no malodor. No pain with calf compression, swelling, warmth, erythema  Assessment: Verruca left foot  Plan: -All treatment options discussed with the patient including all alternatives, risks, complications.  -Sharply debrided the lesion with any complications.  Cleaned skin with alcohol.  Cantharone was applied followed by an occlusive bandage.  Postprocedure instructions discussed.  Tolerated well. -Prescribed Efudex cream. -He will like to use surgical excision for last disorder.  Will start laser treatment next appointment.  He is currently for next 6 weeks on vacation. -Patient encouraged to call the office with any questions, concerns, change in symptoms.   Vivi Barrack DPM  canthrone

## 2022-06-10 NOTE — Patient Instructions (Signed)
Take dressing off in 8 hours and wash the foot with soap and water. If it is hurting or becomes uncomfortable before the 8 hours, go ahead and remove the bandage and wash the area.  If it blisters, apply antibiotic ointment and a band-aid.  Monitor for any signs/symptoms of infection. Call the office immediately if any occur or go directly to the emergency room. Call with any questions/concerns.   

## 2022-08-06 DIAGNOSIS — L578 Other skin changes due to chronic exposure to nonionizing radiation: Secondary | ICD-10-CM | POA: Diagnosis not present

## 2022-08-06 DIAGNOSIS — L814 Other melanin hyperpigmentation: Secondary | ICD-10-CM | POA: Diagnosis not present

## 2022-08-06 DIAGNOSIS — L570 Actinic keratosis: Secondary | ICD-10-CM | POA: Diagnosis not present

## 2022-08-06 DIAGNOSIS — Z85828 Personal history of other malignant neoplasm of skin: Secondary | ICD-10-CM | POA: Diagnosis not present

## 2022-08-06 DIAGNOSIS — L729 Follicular cyst of the skin and subcutaneous tissue, unspecified: Secondary | ICD-10-CM | POA: Diagnosis not present

## 2022-08-06 DIAGNOSIS — D225 Melanocytic nevi of trunk: Secondary | ICD-10-CM | POA: Diagnosis not present

## 2022-08-06 DIAGNOSIS — L821 Other seborrheic keratosis: Secondary | ICD-10-CM | POA: Diagnosis not present

## 2022-08-14 DIAGNOSIS — R634 Abnormal weight loss: Secondary | ICD-10-CM | POA: Diagnosis not present

## 2022-08-14 DIAGNOSIS — L309 Dermatitis, unspecified: Secondary | ICD-10-CM | POA: Diagnosis not present

## 2022-08-14 DIAGNOSIS — Z720 Tobacco use: Secondary | ICD-10-CM | POA: Diagnosis not present

## 2022-08-14 DIAGNOSIS — W57XXXA Bitten or stung by nonvenomous insect and other nonvenomous arthropods, initial encounter: Secondary | ICD-10-CM | POA: Diagnosis not present

## 2022-08-15 ENCOUNTER — Other Ambulatory Visit: Payer: Self-pay | Admitting: Family Medicine

## 2022-08-15 ENCOUNTER — Other Ambulatory Visit: Payer: Self-pay | Admitting: *Deleted

## 2022-08-15 DIAGNOSIS — Z720 Tobacco use: Secondary | ICD-10-CM

## 2022-08-15 MED ORDER — METOPROLOL SUCCINATE ER 50 MG PO TB24: ORAL_TABLET | ORAL | 3 refills | Status: DC

## 2022-08-20 ENCOUNTER — Ambulatory Visit: Payer: Medicare PPO | Admitting: Podiatry

## 2022-08-20 DIAGNOSIS — R609 Edema, unspecified: Secondary | ICD-10-CM | POA: Diagnosis not present

## 2022-08-20 DIAGNOSIS — D2372 Other benign neoplasm of skin of left lower limb, including hip: Secondary | ICD-10-CM

## 2022-08-20 MED ORDER — CEPHALEXIN 500 MG PO CAPS: 500.0000 mg | ORAL_CAPSULE | Freq: Three times a day (TID) | ORAL | 0 refills | Status: DC

## 2022-08-20 NOTE — Patient Instructions (Signed)

## 2022-08-23 ENCOUNTER — Ambulatory Visit (HOSPITAL_BASED_OUTPATIENT_CLINIC_OR_DEPARTMENT_OTHER)
Admission: RE | Admit: 2022-08-23 | Discharge: 2022-08-23 | Disposition: A | Discharge status: Home / Self Care | Payer: Medicare PPO | Source: Ambulatory Visit | Attending: Interventional Cardiology | Admitting: Interventional Cardiology

## 2022-08-23 DIAGNOSIS — I7121 Aneurysm of the ascending aorta, without rupture: Secondary | ICD-10-CM | POA: Insufficient documentation

## 2022-08-23 LAB — POCT I-STAT CREATININE: Creatinine, Ser: 1.4 mg/dL — ABNORMAL HIGH (ref 0.61–1.24)

## 2022-08-23 MED ORDER — IOHEXOL 350 MG/ML SOLN
100.0000 mL | Freq: Once | INTRAVENOUS | Status: AC | PRN
  Administered 2022-08-23: 100 mL via INTRAVENOUS

## 2022-08-26 NOTE — Progress Notes (Signed)
Subjective: Chief Complaint  Patient presents with   Plantar Warts    Pt states he thinks the wart is getting worse than it was 6 months, he wants to talk about other options      72 year old male presents the office with above concerns.  Warm to be getting larger. He recently went on a fishing trip.  No open lesions or any drainage.  Area is tender.   Objective: AAO x3, NAD DP/PT pulses palpable bilaterally, CRT less than 3 seconds Hyperkeratotic lesion left foot submetatarsal and upon debridement there is still pinpoint bleeding and evidence of verruca.  There is localized edema around this area today and appears to be almost like a cyst underneath it but upon debridement it did appear to be better.  There is no fluctuance crepitation.  No malodor.  No pain with calf compression, swelling, warmth, erythema  Assessment: Verruca left foot, swelling  Plan: -All treatment options discussed with the patient including all alternatives, risks, complications.  -Given the increased discomfort as well as swelling, start him on antibiotics and prescribed Keflex.  Recommend Epsom salt soaks.  Hold off on treatment for the wart today. If this is improved next appointment we will likely start lasering with [facility] application as well.  Efudex cream. -Discussed surgical excision -Patient encouraged to call the office with any questions, concerns, change in symptoms.   Vivi Barrack DPM

## 2022-08-28 ENCOUNTER — Encounter: Payer: Self-pay | Admitting: Family Medicine

## 2022-08-28 DIAGNOSIS — H5053 Vertical heterophoria: Secondary | ICD-10-CM | POA: Diagnosis not present

## 2022-08-28 DIAGNOSIS — H5051 Esophoria: Secondary | ICD-10-CM | POA: Diagnosis not present

## 2022-08-28 DIAGNOSIS — H532 Diplopia: Secondary | ICD-10-CM | POA: Diagnosis not present

## 2022-08-30 ENCOUNTER — Ambulatory Visit
Admission: RE | Admit: 2022-08-30 | Discharge: 2022-08-30 | Disposition: A | Discharge status: Home / Self Care | Payer: Medicare PPO | Source: Ambulatory Visit | Attending: Family Medicine | Admitting: Family Medicine

## 2022-08-30 DIAGNOSIS — F17210 Nicotine dependence, cigarettes, uncomplicated: Secondary | ICD-10-CM | POA: Diagnosis not present

## 2022-08-30 DIAGNOSIS — Z136 Encounter for screening for cardiovascular disorders: Secondary | ICD-10-CM | POA: Diagnosis not present

## 2022-08-30 DIAGNOSIS — Z720 Tobacco use: Secondary | ICD-10-CM

## 2022-09-18 DIAGNOSIS — L821 Other seborrheic keratosis: Secondary | ICD-10-CM | POA: Diagnosis not present

## 2022-09-18 DIAGNOSIS — C44229 Squamous cell carcinoma of skin of left ear and external auricular canal: Secondary | ICD-10-CM | POA: Diagnosis not present

## 2022-09-18 DIAGNOSIS — D485 Neoplasm of uncertain behavior of skin: Secondary | ICD-10-CM | POA: Diagnosis not present

## 2022-09-19 ENCOUNTER — Ambulatory Visit: Payer: Medicare PPO | Admitting: Podiatry

## 2022-09-19 DIAGNOSIS — M216X2 Other acquired deformities of left foot: Secondary | ICD-10-CM

## 2022-09-19 DIAGNOSIS — D2372 Other benign neoplasm of skin of left lower limb, including hip: Secondary | ICD-10-CM | POA: Diagnosis not present

## 2022-09-19 NOTE — Patient Instructions (Signed)
Take dressing off in 8 hours and wash the foot with soap and water. If it is hurting or becomes uncomfortable before the 8 hours, go ahead and remove the bandage and wash the area.  If it blisters, apply antibiotic ointment and a band-aid.  Monitor for any signs/symptoms of infection. Call the office immediately if any occur or go directly to the emergency room. Call with any questions/concerns.   

## 2022-09-23 NOTE — Progress Notes (Signed)
Subjective: Chief Complaint  Patient presents with   Laser Treatment    Patient came in today for left foot wart,      72 year old male presents the office with above concerns.  States he is about the same and the lesion is  still present.  It does cause discomfort with pressure.  No drainage or pus.   Objective: AAO x3, NAD DP/PT pulses palpable bilaterally, CRT less than 3 seconds Hyperkeratotic lesion left foot submetatarsal and upon debridement there is still pinpoint bleeding and evidence of verruca.  Patient more callus today however there is still some localized capillary budding consistent with a verruca.  No erythema or warmth.  No drainage or pus.  No pain with calf compression, swelling, warmth, erythema  Assessment: Verruca left foot, skin lesion  Plan: -All treatment options discussed with the patient including all alternatives, risks, complications.  -Separate hyperkeratotic tissue without any complications however there are some minimal bleeding present.  I cleaned this area.  Continue precautions and eye protection I did laser the area that did not have any bleeding to it.  Once completed I did apply Cantharone pop and occlusive bandage.  Postprocedure chest discussed.  Monitor for any signs or symptoms of infection. -Recommend going back to the compound medication as I think the new cream seems to be irritating him some. -Also given his activity he will likely benefit from orthotics help offload.  Will schedule for orthotic with offloading of this area as I do think some of this is the callus as well causing pain, prominent metatarsal head. -Monitor for any clinical signs or symptoms of infection and directed to call the office immediately should any occur or go to the ER.  No follow-ups on file.  Vivi Barrack DPM

## 2022-10-01 DIAGNOSIS — M79672 Pain in left foot: Secondary | ICD-10-CM | POA: Diagnosis not present

## 2022-10-01 DIAGNOSIS — N1831 Chronic kidney disease, stage 3a: Secondary | ICD-10-CM | POA: Diagnosis not present

## 2022-10-01 DIAGNOSIS — M8580 Other specified disorders of bone density and structure, unspecified site: Secondary | ICD-10-CM | POA: Diagnosis not present

## 2022-10-01 DIAGNOSIS — M1A09X0 Idiopathic chronic gout, multiple sites, without tophus (tophi): Secondary | ICD-10-CM | POA: Diagnosis not present

## 2022-10-01 DIAGNOSIS — M1991 Primary osteoarthritis, unspecified site: Secondary | ICD-10-CM | POA: Diagnosis not present

## 2022-10-01 DIAGNOSIS — M452 Ankylosing spondylitis of cervical region: Secondary | ICD-10-CM | POA: Diagnosis not present

## 2022-10-01 DIAGNOSIS — M25551 Pain in right hip: Secondary | ICD-10-CM | POA: Diagnosis not present

## 2022-10-01 DIAGNOSIS — M25511 Pain in right shoulder: Secondary | ICD-10-CM | POA: Diagnosis not present

## 2022-10-01 DIAGNOSIS — M65342 Trigger finger, left ring finger: Secondary | ICD-10-CM | POA: Diagnosis not present

## 2022-10-07 DIAGNOSIS — E785 Hyperlipidemia, unspecified: Secondary | ICD-10-CM | POA: Diagnosis not present

## 2022-10-07 DIAGNOSIS — I10 Essential (primary) hypertension: Secondary | ICD-10-CM | POA: Diagnosis not present

## 2022-10-07 DIAGNOSIS — M109 Gout, unspecified: Secondary | ICD-10-CM | POA: Diagnosis not present

## 2022-10-09 DIAGNOSIS — J449 Chronic obstructive pulmonary disease, unspecified: Secondary | ICD-10-CM | POA: Diagnosis not present

## 2022-10-09 DIAGNOSIS — D84821 Immunodeficiency due to drugs: Secondary | ICD-10-CM | POA: Diagnosis not present

## 2022-10-09 DIAGNOSIS — M459 Ankylosing spondylitis of unspecified sites in spine: Secondary | ICD-10-CM | POA: Diagnosis not present

## 2022-10-09 DIAGNOSIS — I700 Atherosclerosis of aorta: Secondary | ICD-10-CM | POA: Diagnosis not present

## 2022-10-09 DIAGNOSIS — I712 Thoracic aortic aneurysm, without rupture, unspecified: Secondary | ICD-10-CM | POA: Diagnosis not present

## 2022-10-09 DIAGNOSIS — Z0000 Encounter for general adult medical examination without abnormal findings: Secondary | ICD-10-CM | POA: Diagnosis not present

## 2022-10-09 DIAGNOSIS — E785 Hyperlipidemia, unspecified: Secondary | ICD-10-CM | POA: Diagnosis not present

## 2022-10-09 DIAGNOSIS — N1831 Chronic kidney disease, stage 3a: Secondary | ICD-10-CM | POA: Diagnosis not present

## 2022-10-09 DIAGNOSIS — I130 Hypertensive heart and chronic kidney disease with heart failure and stage 1 through stage 4 chronic kidney disease, or unspecified chronic kidney disease: Secondary | ICD-10-CM | POA: Diagnosis not present

## 2022-10-10 ENCOUNTER — Ambulatory Visit: Payer: Medicare PPO | Admitting: Podiatry

## 2022-10-10 DIAGNOSIS — D2372 Other benign neoplasm of skin of left lower limb, including hip: Secondary | ICD-10-CM | POA: Diagnosis not present

## 2022-10-10 DIAGNOSIS — B070 Plantar wart: Secondary | ICD-10-CM | POA: Diagnosis not present

## 2022-10-10 NOTE — Patient Instructions (Signed)

## 2022-10-10 NOTE — Progress Notes (Signed)
Subjective: Chief Complaint  Patient presents with   Plantar Warts    Left foot wart, still get pain. It's sore and red around the wart.     72 year old male presents the office with above concerns.  He is switched back to using the compound cream.  Areas not healing.  No drainage or pus.   Objective: AAO x3, NAD DP/PT pulses palpable bilaterally, CRT less than 3 seconds Hyperkeratotic lesion noted left foot as pictured below there is some granulation tissue present on the central aspect.  This does appear to have changed over the last 2 visits.  Prior it was a flat callus but seems to have morphed and change as patient below.  There is no drainage or pus.  Subjectively gets tenderness with walking.  No pain with calf compression, swelling, warmth, erythema    Assessment: Verruca left foot, skin lesion  Plan: -All treatment options discussed with the patient including all alternatives, risks, complications.  -As the lesions change over the last 2 appointments I recommended biopsy of the lesion.  We discussed punch biopsy including alternatives risks complications.  At this time he agrees and wants to proceed with biopsy.  I cleaned the skin with alcohol and a mixture of 2.5 mL of lidocaine, Marcaine plain and 0.5 mL of lidocaine with epinephrine was mixed for total of 3 mL. This is injected subdermally.  I cleaned the skin with Betadine.  I then utilized a 3 mm punch biopsy and I took 2 separate specimens and I sent this to pathology to evaluate.  Hemostasis was achieved.  Irrigated.  Antibiotic ointment was applied followed by dressing.  He tolerated well.  Postprocedure instructions discussed. -Monitor for any clinical signs or symptoms of infection and directed to call the office immediately should any occur or go to the ER.  Return in about 10 days (around 10/20/2022) for follow up biopsy.  Vivi Barrack DPM

## 2022-10-21 ENCOUNTER — Encounter: Payer: Self-pay | Admitting: Podiatry

## 2022-10-21 DIAGNOSIS — H401131 Primary open-angle glaucoma, bilateral, mild stage: Secondary | ICD-10-CM | POA: Diagnosis not present

## 2022-10-23 DIAGNOSIS — B070 Plantar wart: Secondary | ICD-10-CM | POA: Diagnosis not present

## 2022-10-23 DIAGNOSIS — C44229 Squamous cell carcinoma of skin of left ear and external auricular canal: Secondary | ICD-10-CM | POA: Diagnosis not present

## 2022-10-28 ENCOUNTER — Ambulatory Visit: Payer: Medicare PPO | Admitting: Podiatry

## 2022-10-28 ENCOUNTER — Encounter: Payer: Self-pay | Admitting: Podiatry

## 2022-10-28 DIAGNOSIS — D84821 Immunodeficiency due to drugs: Secondary | ICD-10-CM | POA: Insufficient documentation

## 2022-10-28 DIAGNOSIS — D2372 Other benign neoplasm of skin of left lower limb, including hip: Secondary | ICD-10-CM

## 2022-10-28 DIAGNOSIS — B070 Plantar wart: Secondary | ICD-10-CM | POA: Insufficient documentation

## 2022-10-28 DIAGNOSIS — C44229 Squamous cell carcinoma of skin of left ear and external auricular canal: Secondary | ICD-10-CM | POA: Insufficient documentation

## 2022-10-28 DIAGNOSIS — N400 Enlarged prostate without lower urinary tract symptoms: Secondary | ICD-10-CM | POA: Insufficient documentation

## 2022-10-29 ENCOUNTER — Other Ambulatory Visit: Payer: Self-pay | Admitting: Family Medicine

## 2022-10-29 ENCOUNTER — Encounter: Payer: Self-pay | Admitting: Cardiovascular Disease

## 2022-10-29 ENCOUNTER — Ambulatory Visit: Payer: Medicare PPO | Attending: Cardiovascular Disease | Admitting: Cardiovascular Disease

## 2022-10-29 VITALS — BP 120/68 | HR 55 | Ht 63.0 in | Wt 141.8 lb

## 2022-10-29 DIAGNOSIS — I10 Essential (primary) hypertension: Secondary | ICD-10-CM | POA: Diagnosis not present

## 2022-10-29 DIAGNOSIS — E7800 Pure hypercholesterolemia, unspecified: Secondary | ICD-10-CM | POA: Diagnosis not present

## 2022-10-29 DIAGNOSIS — I7121 Aneurysm of the ascending aorta, without rupture: Secondary | ICD-10-CM

## 2022-10-29 DIAGNOSIS — R911 Solitary pulmonary nodule: Secondary | ICD-10-CM

## 2022-10-29 DIAGNOSIS — I351 Nonrheumatic aortic (valve) insufficiency: Secondary | ICD-10-CM

## 2022-10-29 DIAGNOSIS — Z720 Tobacco use: Secondary | ICD-10-CM

## 2022-10-29 NOTE — Progress Notes (Signed)
Cardiology Office Note:    Date:  10/29/2022   ID:  Ryan [name], DOB 01-05-1951, MRN [id number]  PCP:  Camie Patience, [name]   [facility] Providers Cardiologist:  Tonny Bollman, MD     Referring MD: Shirlean Mylar, MD   Chief Complaint  Patient presents with   Shortness of Breath    History of Present Illness:    Ryan [name] is a 72 y.o. male presenting for follow-up evaluation.  He has been followed by Dr. Katrinka Blazing and I will be taking over his cardiac care in Dr. Michaelle Copas retirement.  The patient has a history of bioprosthetic aortic valve replacement in 2011.  He was last seen by Dr. Katrinka Blazing in January 2024.  An echocardiogram at that time demonstrated normal LVEF of 60 to 65%, grade 1 diastolic dysfunction, normal RV function, moderate calcification and thickening of the prosthetic aortic valve leaflets with a mean gradient of 12 mmHg and mild to moderate paravalvular regurgitation.  The patient is also been followed for a fusiform ascending aortic aneurysm recently assessed by a CT angiogram of the chest in June 2024 demonstrating stable dimensions over several years with a maximal diameter of 4.5 cm.  The patient is here with his wife today. He quit smoking last month and is very determined to stay quit. He exercises 3 days per week and has noticed worsening exercise capacity during his swimming. He attributes this to shortness of breath. States that he feels 'tired' with exercise.  He does not have any exertional chest pain or pressure.  He denies orthopnea, PND, or leg edema.  He recently had a lung cancer screening CT that demonstrated a suspicious nodule and there are plans for 76-month follow-up.  Past Medical History:  Diagnosis Date   Ankylosing spondylitis (HCC)    Aortic aneurysm of unspecified site, ruptured [facility])    last aortic root diameter was 3.9 cm   Atrial fibrillation (HCC)    Post-op A fib   Benign hematuria    BPH (benign prostatic hyperplasia)    Chronic  airway obstruction, not elsewhere classified    DOE possibly related to this problem and some restriction from ankylosing spondylitis   Essential hypertension, benign    controlled   Heart valve replaced by other means 2011   s/p bioprosthetic aortic valve. Mild Pero LaVelle Philbert Riser is followed clinically and by exam over the past 2-3 years.   Hypertension    IBS (irritable bowel syndrome)    Pure hypercholesterolemia    Renal insufficiency    Thoracic aortic aneurysm (HCC)    treated with aortoplasty at the time of aortic valve replacement, [facility], 09/2009    Past Surgical History:  Procedure Laterality Date   AORTIC VALVE REPLACEMENT  09/21/09   With bioprosthesis at the [facility]    Current Medications: Current Meds  Medication Sig   allopurinol (ZYLOPRIM) 300 MG tablet Take 150 mg by mouth daily.   amoxicillin (AMOXIL) 500 MG capsule Take 2,000 mg by mouth See admin instructions. 1 hour prior to dental appointment   Ascorbic Acid (VITAMIN C) 1000 MG tablet Take 1,000 mg by mouth daily.   Cholecalciferol (VITAMIN D-3) 25 MCG (1000 UT) CAPS 1 capsule Orally Once a day for 30 day(s)   Coenzyme Q10 (COQ10) 200 MG CAPS Take 200 mg by mouth daily.   colchicine 0.6 MG tablet Take 0.6 mg by mouth as needed.   Cyanocobalamin (VITAMIN B12) 1000 MCG TBCR 1 tablet Orally  Once a day for 30 day(s)   fluorouracil (EFUDEX) 5 % cream Apply topically 2 (two) times daily.   HUMIRA PEN 40 MG/0.4ML PNKT Inject 40 mg as directed 2 (two) times a week.   metoprolol succinate (TOPROL-XL) 50 MG 24 hr tablet TAKE 1 TABLET BY MOUTH  DAILY WITH OR IMMEDIATELY  FOLLOWING A MEAL   Multiple Vitamin (MULTIVITAMIN) tablet Take 1 tablet by mouth daily.   OMEGA-3 KRILL OIL PO Take 350 mg by mouth daily.   predniSONE (DELTASONE) 10 MG tablet Take 10 mg by mouth as needed.   rosuvastatin (CRESTOR) 20 MG tablet TAKE 1 TABLET EVERY DAY   sildenafil (REVATIO) 20 MG tablet Take 20 mg by mouth as needed.    tamsulosin (FLOMAX) 0.4 MG CAPS capsule Take 0.4 mg by mouth 2 (two) times daily.   Tiotropium Bromide-Olodaterol (STIOLTO RESPIMAT) 2.5-2.5 MCG/ACT AERS Inhale 2 puffs into the lungs daily. (Patient taking differently: Inhale 2 puffs into the lungs as needed.)   traZODone (DESYREL) 50 MG tablet    VYZULTA 0.024 % SOLN Place 1 drop into both eyes at bedtime.   [DISCONTINUED] albuterol (PROVENTIL HFA;VENTOLIN HFA) 108 (90 BASE) MCG/ACT inhaler Inhale 2 puffs into the lungs every 4 (four) hours as needed for wheezing or shortness of breath.   [DISCONTINUED] Spacer/Aero-Holding Chambers (AEROCHAMBER PLUS) inhaler Use as instructed     Allergies:   Azithromycin   Social History   Socioeconomic History   Marital status: Married    Spouse name: Not on file   Number of children: Not on file   Years of education: Not on file   Highest education level: Not on file  Occupational History   Not on file  Tobacco Use   Smoking status: Former    Current packs/day: 0.00    Average packs/day: 1 pack/day for 50.0 years (50.0 ttl pk-yrs)    Types: Cigarettes, Cigars    Start date: 01/18/1970    Quit date: 01/19/2020    Years since quitting: 2.7   Smokeless tobacco: Never   Tobacco comments:    last puff on cigar 3 weeks ago, always smokes black and milds  Vaping Use   Vaping status: Never Used  Substance and Sexual Activity   Alcohol use: Yes    Alcohol/week: 7.0 standard drinks of alcohol    Types: 7 Glasses of wine per week   Drug use: No   Sexual activity: Not on file  Other Topics Concern   Not on file  Social History Narrative   Not on file   Social Determinants of Health   Financial Resource Strain: Not on file  Food Insecurity: Not on file  Transportation Needs: Not on file  Physical Activity: Not on file  Stress: Not on file  Social Connections: Not on file     Family History: The patient's family history includes Cancer in his mother; Cirrhosis in his father;  Congestive Heart Failure in his father; Emphysema in his mother; Macular degeneration in his mother; Parkinson's disease in his mother.  ROS:   Please see the history of present illness.    All other systems reviewed and are negative.  EKGs/Labs/Other Studies Reviewed:    The following studies were reviewed today: CT angiogram June 2024 showed stable dimensions of an ascending aortic aneurysm at 4.5 cm.     Echocardiogram January 2024 showed normal LVEF of 60 to 65%, grade 1 diastolic dysfunction, normal RV function, normal mitral valve function, bioprosthetic aortic valve with moderate calcification  and thickening of the prosthetic leaflets and mild to moderate perivalvular regurgitation.  The mean transvalvular gradient is 12 mmHg.  Recent Labs: 08/23/2022: Creatinine, Ser 1.40  Recent Lipid Panel    Component Value Date/Time   CHOL 138 11/28/2017 0917   TRIG 60 11/28/2017 0917   HDL 52 11/28/2017 0917   CHOLHDL 2.7 11/28/2017 0917   LDLCALC 74 11/28/2017 0917     Risk Assessment/Calculations:                Physical Exam:    VS:  BP 120/68   Pulse (!) 55   Ht 5\' 3"  (1.6 m)   Wt 141 lb 12.8 oz (64.3 kg)   SpO2 97%   BMI 25.12 kg/m     Wt Readings from Last 3 Encounters:  10/29/22 141 lb 12.8 oz (64.3 kg)  03/08/22 144 lb 6.4 oz (65.5 kg)  03/13/21 149 lb 3.2 oz (67.7 kg)     GEN:  Well nourished, well developed in no acute distress HEENT: Normal NECK: No JVD; No carotid bruits LYMPHATICS: No lymphadenopathy CARDIAC: RRR, 2/6 early peaking systolic ejection murmur at the right upper sternal border, no diastolic murmur, crisp aortic closure sound RESPIRATORY:  Clear to auscultation without rales, wheezing or rhonchi  ABDOMEN: Soft, non-tender, non-distended MUSCULOSKELETAL:  No edema; No deformity  SKIN: Warm and dry NEUROLOGIC:  Alert and oriented x 3 PSYCHIATRIC:  Normal affect   ASSESSMENT:    1. Nonrheumatic aortic valve insufficiency   2. Aneurysm  of ascending aorta without rupture (HCC)   3. Essential hypertension, benign   4. Pure hypercholesterolemia   5. Tobacco abuse    PLAN:    In order of problems listed above:  Patient's history and imaging studies are reviewed.  He is 13 years out from bioprosthetic aortic valve replacement.  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  I cannot appreciate a diastolic murmur on his exam.  He appears to have had mild paravalvular leak for a long time.  I have recommended a surveillance echocardiogram and follow-up visit in 6 months.  I do not think he is approaching the need for aortic valve replacement in the near future.  Hopefully we can keep an eye on this as long as it remains stable.  I suspect his exertional dyspnea may be pulmonary related with his longstanding history of tobacco use. Stable by recent CTA.  4.5 cm ascending aortic aneurysm. Blood pressure well-controlled on metoprolol succinate. Lipids reviewed with a recent LDL cholesterol of 68.  He is treated with rosuvastatin and is at goal. He has quit smoking about 4 to 6 weeks ago and is motivated to remain quit.      Medication Adjustments/Labs and Tests Ordered: Current medicines are reviewed at length with the patient today.  Concerns regarding medicines are outlined above.  No orders of the defined types were placed in this encounter.  No orders of the defined types were placed in this encounter.   Patient Instructions  Medication Instructions:  Your physician recommends that you continue on your current medications as directed. Please refer to the Current Medication list given to you today. *If you need a refill on your cardiac medications before your next appointment, please call your pharmacy*   Lab Work: NONE If you have labs (blood work) drawn today and your tests are completely normal, you will receive your results only by: MyChart Message (if you have  MyChart) OR A paper  copy in the mail If you have any lab test that is abnormal or we need to change your treatment, we will call you to review the results.   Testing/Procedures: ECHO Your physician has requested that you have an echocardiogram. Echocardiography is a painless test that uses sound waves to create images of your heart. It provides your doctor with information about the size and shape of your heart and how well your heart's chambers and valves are working. This procedure takes approximately one hour. There are no restrictions for this procedure. Please do NOT wear cologne, perfume, aftershave, or lotions (deodorant is allowed). Please arrive 15 minutes prior to your appointment time.  Follow-Up: At [facility], you and your health needs are our priority.  As part of our continuing mission to provide you with exceptional heart care, we have created designated Provider Care Teams.  These Care Teams include your primary Cardiologist (physician) and Advanced Practice Providers (APPs -  Physician Assistants and Nurse Practitioners) who all work together to provide you with the care you need, when you need it.  Your next appointment:   6 month(s)  Provider:   Tonny Bollman, MD     Signed, Tonny Bollman, MD  10/29/2022 10:38 AM    [facility]

## 2022-10-29 NOTE — Patient Instructions (Signed)
Medication Instructions:  Your physician recommends that you continue on your current medications as directed. Please refer to the Current Medication list given to you today.  *If you need a refill on your cardiac medications before your next appointment, please call your pharmacy*   Lab Work: NONE If you have labs (blood work) drawn today and your tests are completely normal, you will receive your results only by: MyChart Message (if you have MyChart) OR A paper copy in the mail If you have any lab test that is abnormal or we need to change your treatment, we will call you to review the results.   Testing/Procedures: ECHO Your physician has requested that you have an echocardiogram. Echocardiography is a painless test that uses sound waves to create images of your heart. It provides your doctor with information about the size and shape of your heart and how well your heart's chambers and valves are working. This procedure takes approximately one hour. There are no restrictions for this procedure. Please do NOT wear cologne, perfume, aftershave, or lotions (deodorant is allowed). Please arrive 15 minutes prior to your appointment time.  Follow-Up: At [facility], you and your health needs are our priority.  As part of our continuing mission to provide you with exceptional heart care, we have created designated Provider Care Teams.  These Care Teams include your primary Cardiologist (physician) and Advanced Practice Providers (APPs -  Physician Assistants and Nurse Practitioners) who all work together to provide you with the care you need, when you need it.  Your next appointment:   6 month(s)  Provider:   Michael Cooper, MD   

## 2022-10-30 NOTE — Progress Notes (Signed)
Subjective: Chief Complaint  Patient presents with   Benign neoplasm of skin of left lower extremity    Pt stated he would like to know where does he go from here what are the next steps.     72 year old male presents the office with above concerns.  He is to keep antibiotic ointment on the area to biopsy.  It was sore at first but tenderness has resolved.  Not been using more cream since I saw him last.  No drainage or pus.  No other concerns.  Objective: AAO x3, NAD DP/PT pulses palpable bilaterally, CRT less than 3 seconds Hyperkeratotic lesion noted left foot which appears to be similar in appearance compared to what it was previously.  There is no surrounding erythema, ascending size there is no drainage or pus.  There is no fluctuation, crepitation, malodor. No pain with calf compression, swelling, warmth, erythema    Assessment: Verruca left foot, skin lesion  Plan: -All treatment options discussed with the patient including all alternatives, risks, complications.  -Again reviewed biopsy.  We discussed continuing with conservative treatment we discussed other options for the verruca.  Today following standard precautions and using eye protection I did laser the warts without any complications.  Plan continue Efudex again twice daily on consistent basis.  Return in about 2 weeks (around 11/11/2022).  Vivi Barrack DPM

## 2022-11-07 DIAGNOSIS — M65342 Trigger finger, left ring finger: Secondary | ICD-10-CM | POA: Diagnosis not present

## 2022-11-07 DIAGNOSIS — M79641 Pain in right hand: Secondary | ICD-10-CM | POA: Diagnosis not present

## 2022-11-07 DIAGNOSIS — M79642 Pain in left hand: Secondary | ICD-10-CM | POA: Diagnosis not present

## 2022-11-12 ENCOUNTER — Ambulatory Visit: Payer: Medicare PPO | Admitting: Podiatry

## 2022-11-12 DIAGNOSIS — D492 Neoplasm of unspecified behavior of bone, soft tissue, and skin: Secondary | ICD-10-CM

## 2022-11-12 DIAGNOSIS — D2372 Other benign neoplasm of skin of left lower limb, including hip: Secondary | ICD-10-CM | POA: Diagnosis not present

## 2022-11-12 NOTE — Progress Notes (Signed)
Subjective: No chief complaint on file.  72 year old male presents the office with above concerns.  He states it is still tender with pressure but may be a little better.   Objective: AAO x3, NAD DP/PT pulses palpable bilaterally, CRT less than 3 seconds Hyperkeratotic lesion noted left foot which appears to be somewhat smaller in appearance compared to what it was previously.  There is no surrounding erythema, ascending size there is no drainage or pus.  There is no fluctuation, crepitation, malodor. No pain with calf compression, swelling, warmth, erythema   Assessment: Verruca left foot, skin lesion  Plan: -All treatment options discussed with the patient including all alternatives, risks, complications.  -Following standard precautions and using eye protection I did laser the warts without any complications.  -Continue Efudex again twice daily on consistent basis.  Vivi Barrack DPM

## 2022-11-12 NOTE — Addendum Note (Signed)
Addended by: Ovid Curd R on: 11/12/2022 04:51 PM   Modules accepted: Level of Service

## 2022-11-18 DIAGNOSIS — M79672 Pain in left foot: Secondary | ICD-10-CM | POA: Diagnosis not present

## 2022-11-18 DIAGNOSIS — M25551 Pain in right hip: Secondary | ICD-10-CM | POA: Diagnosis not present

## 2022-11-18 DIAGNOSIS — M65342 Trigger finger, left ring finger: Secondary | ICD-10-CM | POA: Diagnosis not present

## 2022-11-18 DIAGNOSIS — M1A09X0 Idiopathic chronic gout, multiple sites, without tophus (tophi): Secondary | ICD-10-CM | POA: Diagnosis not present

## 2022-11-18 DIAGNOSIS — M25511 Pain in right shoulder: Secondary | ICD-10-CM | POA: Diagnosis not present

## 2022-11-18 DIAGNOSIS — M1991 Primary osteoarthritis, unspecified site: Secondary | ICD-10-CM | POA: Diagnosis not present

## 2022-11-18 DIAGNOSIS — Z111 Encounter for screening for respiratory tuberculosis: Secondary | ICD-10-CM | POA: Diagnosis not present

## 2022-11-18 DIAGNOSIS — M8580 Other specified disorders of bone density and structure, unspecified site: Secondary | ICD-10-CM | POA: Diagnosis not present

## 2022-11-18 DIAGNOSIS — M79605 Pain in left leg: Secondary | ICD-10-CM | POA: Diagnosis not present

## 2022-11-18 DIAGNOSIS — M452 Ankylosing spondylitis of cervical region: Secondary | ICD-10-CM | POA: Diagnosis not present

## 2022-11-28 ENCOUNTER — Encounter: Payer: Self-pay | Admitting: Family Medicine

## 2022-12-06 ENCOUNTER — Other Ambulatory Visit: Payer: Medicare PPO

## 2022-12-12 ENCOUNTER — Ambulatory Visit
Admission: RE | Admit: 2022-12-12 | Discharge: 2022-12-12 | Disposition: A | Discharge status: Home / Self Care | Payer: Medicare PPO | Source: Ambulatory Visit | Attending: Family Medicine | Admitting: Family Medicine

## 2022-12-12 DIAGNOSIS — R911 Solitary pulmonary nodule: Secondary | ICD-10-CM | POA: Diagnosis not present

## 2022-12-12 DIAGNOSIS — Z87891 Personal history of nicotine dependence: Secondary | ICD-10-CM | POA: Diagnosis not present

## 2022-12-16 ENCOUNTER — Encounter: Payer: Self-pay | Admitting: Podiatry

## 2022-12-16 ENCOUNTER — Ambulatory Visit: Payer: Medicare PPO | Admitting: Podiatry

## 2022-12-16 VITALS — BP 103/56 | HR 54 | Resp 18 | Ht 63.0 in | Wt 141.0 lb

## 2022-12-16 DIAGNOSIS — D2372 Other benign neoplasm of skin of left lower limb, including hip: Secondary | ICD-10-CM | POA: Diagnosis not present

## 2022-12-18 NOTE — Progress Notes (Signed)
Subjective: Chief Complaint  Patient presents with   Routine Post Op    PATIENT STATES THAT IT IS GETTING SMALLER AND IS DOING BETTER , STILL HURTS A LITTLE    72 year old male presents the office with above concerns.  Overall he is feels that he is doing better.  Still tender at times.  He recently just been on vacation to [place] and did a lot of walking.  Denies any drainage or pus or any swelling or redness.  He has no new concerns.   Objective: AAO x3, NAD DP/PT pulses palpable bilaterally, CRT less than 3 seconds Hyperkeratotic lesion noted left foot which appears to be somewhat smaller in appearance compared to what it was previously.  Patient more superficial as well.  There is no surrounding erythema, ascending size there is no drainage or pus.  There is no fluctuation, crepitation, malodor. No pain with calf compression, swelling, warmth, erythema   Assessment: Verruca left foot, skin lesion  Plan: -All treatment options discussed with the patient including all alternatives, risks, complications.  -Following standard precautions and using eye protection I did laser the warts without any complications.   -Continue Efudex again twice daily on consistent basis. -Monitor for any clinical signs or symptoms of infection and directed to call the office immediately should any occur or go to the ER.  Vivi Barrack DPM

## 2022-12-30 ENCOUNTER — Ambulatory Visit: Payer: Medicare PPO | Admitting: Podiatry

## 2022-12-30 ENCOUNTER — Encounter: Payer: Self-pay | Admitting: Podiatry

## 2022-12-30 DIAGNOSIS — B070 Plantar wart: Secondary | ICD-10-CM | POA: Diagnosis not present

## 2022-12-30 NOTE — Progress Notes (Signed)
Subjective: Chief Complaint  Patient presents with   Routine Post Op    PATIENT STATES ITS IS GETTING WORST AND BETTER     72 year old male presents the office with above concerns.  Overall he is feels that he is doing better.  He states that he cannot really see it present by walking on he has not been having the pain he had previously.  No swelling redness or any drainage.  No open lesions.  No other concerns.  Objective: AAO x3, NAD DP/PT pulses palpable bilaterally, CRT less than 3 seconds Hyperkeratotic lesion noted left foot which appears to be somewhat smaller in appearance compared to what it was previously.  Patient more superficial.  No surrounding erythema, ascending size.  No drainage or pus or signs of infection. No pain with calf compression, swelling, warmth, erythema   Assessment: Verruca left foot, skin lesion  Plan: -All treatment options discussed with the patient including all alternatives, risks, complications.  -Following standard precautions and using eye protection I did laser the warts without any complications.   -Continue Efudex again twice daily on consistent basis. -Monitor for any clinical signs or symptoms of infection and directed to call the office immediately should any occur or go to the ER.  Vivi Barrack DPM

## 2023-01-07 ENCOUNTER — Encounter: Payer: Self-pay | Admitting: Pulmonary Disease

## 2023-01-07 ENCOUNTER — Ambulatory Visit: Payer: Medicare PPO | Admitting: Pulmonary Disease

## 2023-01-07 ENCOUNTER — Other Ambulatory Visit: Payer: Self-pay

## 2023-01-07 VITALS — BP 130/80 | HR 58 | Temp 97.8°F | Ht 63.0 in | Wt 146.6 lb

## 2023-01-07 DIAGNOSIS — R0602 Shortness of breath: Secondary | ICD-10-CM | POA: Diagnosis not present

## 2023-01-07 DIAGNOSIS — R911 Solitary pulmonary nodule: Secondary | ICD-10-CM

## 2023-01-07 DIAGNOSIS — J4530 Mild persistent asthma, uncomplicated: Secondary | ICD-10-CM

## 2023-01-07 NOTE — Progress Notes (Signed)
Synopsis: Referred in by Camie Patience, [name]   Subjective:   PATIENT ID: Ryan [name] GENDER: male DOB: [birth year], MRN: [id number]  Chief Complaint  Patient presents with   Consult    Shortness of breath with exertion. Occasional wheezing. No cough.     HPI Ryan [name] is a pleasant 72 year old male patient with a past medical history of ankylosing spondylitis on Humira, hyperlipidemia depression and COPD not on any inhalers presenting for evaluation of a pulmonary nodule.  He is enrolled in the lung cancer screening program and underwent a CT chest on 08/30/2022 that showed a 6.7 mm left upper lobe solid nodule behind the aortic arch.  Therefore he had a repeat CT chest in 3 months.  His CT chest was on 12/2022 and showed an 8.6 mm new left upper lobe nodule.  He was referred to [place] for further evaluation.  He denies any symptoms, one of the symptoms are chronic which are dyspnea on exertion.  He is active swims 15 laps.  He was just in [place] on vacation and was able to walk around without limitations.  He denies any loss of appetite and no significant weight changes.  Family history -bladder cancer in his mother who was a smoker  Social history-active smoker unclear amount but smoked cigarillos for the past 10 years.   ROS All systems were reviewed and are negative except for the above. Objective:   Vitals:   01/07/23 1021 01/07/23 1033  BP: (!) 140/88 130/80  Pulse: (!) 58   Temp: 97.8 F (36.6 C)   TempSrc: Temporal   SpO2: 98%   Weight: 146 lb 9.6 oz (66.5 kg)   Height: 5\' 3"  (1.6 m)    98% on RA BMI Readings from Last 3 Encounters:  01/07/23 25.97 kg/m  12/16/22 24.98 kg/m  10/29/22 25.12 kg/m   Wt Readings from Last 3 Encounters:  01/07/23 146 lb 9.6 oz (66.5 kg)  12/16/22 141 lb (64 kg)  10/29/22 141 lb 12.8 oz (64.3 kg)    Physical Exam GEN: NAD, Healthy Appearing HEENT: Supple Neck, Reactive Pupils, EOMI  CVS: Normal S1, Normal S2, RRR, No murmurs or  ES appreciated  Lungs: Clear bilateral air entry.  Abdomen: Soft, non tender, non distended, + BS  Extremities: Warm and well perfused, No edema  Skin: No suspicious lesions appreciated  Psych: Normal Affect  Ancillary Information   CBC    Component Value Date/Time   WBC 8.0 11/17/2017 1032   WBC 9.4 12/21/2015 0038   RBC 4.65 11/17/2017 1032   RBC 4.45 12/21/2015 0038   HGB 15.5 11/17/2017 1032   HCT 46.0 11/17/2017 1032   PLT 199 11/17/2017 1032   MCV 99 (H) 11/17/2017 1032   MCH 33.3 (H) 11/17/2017 1032   MCH 33.3 12/21/2015 0038   MCHC 33.7 11/17/2017 1032   MCHC 33.6 12/21/2015 0038   RDW 13.9 11/17/2017 1032   Labs and imaging were reviewed.     Latest Ref Rng & Units 11/22/2015    3:57 PM  PFT Results  FVC-Pre L 2.57   FVC-Predicted Pre % 71   FVC-Post L 2.44   FVC-Predicted Post % 67   Pre FEV1/FVC % % 75   Post FEV1/FCV % % 79   FEV1-Pre L 1.93   FEV1-Predicted Pre % 71   FEV1-Post L 1.93   DLCO uncorrected ml/min/mmHg 16.81   DLCO UNC% % 69   DLCO corrected ml/min/mmHg 17.22   DLCO COR %Predicted %  70   DLVA Predicted % 98   TLC L 4.46   TLC % Predicted % 76   RV % Predicted % 74      Assessment & Plan:  Ryan [name] is a pleasant 72 year old male patient with a past medical history of ankylosing spondylitis on Humira, hyperlipidemia depression and COPD not on any inhalers presenting for evaluation of a pulmonary nodule.  #LUL Nodule 6.7 mm  Situated in a very challenging are, behind the aortic arch. Remained stable after 3 months. Mayo score 17%   #LUL posterior nodule 8.31mm  This is new and developed within 3 months which makes malignancy less likely but still on the differential. Mayo 20%. Given that it appeared in ashort period of time I am less concerned for malignancy. However, he does have risk factors including smoking and therefore repeating a CT chest is more appropriate at this time.   []  CT chest wo contrast.   #History of COPD with  emphysema  Not on any inhalers. Minimal symptoms.   []  PFTs   Return in about 4 weeks (around 02/04/2023).  I spent 60 minutes caring for this patient today, including preparing to see the patient, obtaining a medical history , reviewing a separately obtained history, performing a medically appropriate examination and/or evaluation, counseling and educating the patient/family/caregiver, ordering medications, tests, or procedures, documenting clinical information in the electronic health record, and independently interpreting results (not separately reported/billed) and communicating results to the patient/family/caregiver  Janann Colonel, MD [facility] 01/07/2023 2:06 PM

## 2023-01-07 NOTE — Progress Notes (Signed)
I called pt back after he left me 2 VMs asking to be scheduled with Dr.Mohamed. I told the pt that I did discuss his referral with Dr.Mohamed and his recommendation was that he get an appt with Pulmonology first. The pt was upset because he "smoked for 50 years and I know it's cancer" pt also states "I've been doing a lot of reading and I know I need a PET scan" I told the pt I would relay his concerns with Dr.Mohamed, but emphasized the importance of  scheduling an appt with pulmonology as well. Pt was hesitant to do so as there was not any availability for 3 weeks.  Pt asked who would be doing the biopsy, and I told him pulmonology, so getting established with them is important. Pt verbalized understanding and states that he's going to call them right after we hang up.

## 2023-01-08 ENCOUNTER — Encounter: Payer: Self-pay | Admitting: Pulmonary Disease

## 2023-01-08 ENCOUNTER — Other Ambulatory Visit: Payer: Self-pay | Admitting: *Deleted

## 2023-01-08 MED ORDER — ROSUVASTATIN CALCIUM 20 MG PO TABS: 20.0000 mg | ORAL_TABLET | Freq: Every day | ORAL | 3 refills | Status: DC

## 2023-01-16 ENCOUNTER — Ambulatory Visit (HOSPITAL_BASED_OUTPATIENT_CLINIC_OR_DEPARTMENT_OTHER)
Admission: RE | Admit: 2023-01-16 | Discharge: 2023-01-16 | Disposition: A | Discharge status: Home / Self Care | Payer: Medicare PPO | Source: Ambulatory Visit | Attending: Pulmonary Disease | Admitting: Pulmonary Disease

## 2023-01-16 DIAGNOSIS — R0602 Shortness of breath: Secondary | ICD-10-CM | POA: Insufficient documentation

## 2023-01-16 DIAGNOSIS — R918 Other nonspecific abnormal finding of lung field: Secondary | ICD-10-CM | POA: Diagnosis not present

## 2023-01-16 DIAGNOSIS — I7121 Aneurysm of the ascending aorta, without rupture: Secondary | ICD-10-CM | POA: Diagnosis not present

## 2023-01-20 ENCOUNTER — Ambulatory Visit (HOSPITAL_BASED_OUTPATIENT_CLINIC_OR_DEPARTMENT_OTHER): Payer: Medicare PPO | Admitting: Student in an Organized Health Care Education/Training Program

## 2023-01-20 DIAGNOSIS — R0602 Shortness of breath: Secondary | ICD-10-CM | POA: Diagnosis not present

## 2023-01-20 LAB — PULMONARY FUNCTION TEST
DL/VA % pred: 106 %
DL/VA: 4.41 ml/min/mmHg/L
DLCO cor % pred: 83 %
DLCO cor: 16.74 ml/min/mmHg
DLCO unc % pred: 83 %
DLCO unc: 16.74 ml/min/mmHg
FEF 25-75 Post: 1.86 L/s
FEF 25-75 Pre: 1.25 L/s
FEF2575-%Change-Post: 48 %
FEF2575-%Pred-Post: 108 %
FEF2575-%Pred-Pre: 73 %
FEV1-%Change-Post: 7 %
FEV1-%Pred-Post: 77 %
FEV1-%Pred-Pre: 72 %
FEV1-Post: 1.77 L
FEV1-Pre: 1.65 L
FEV1FVC-%Change-Post: 6 %
FEV1FVC-%Pred-Pre: 102 %
FEV6-%Change-Post: 1 %
FEV6-%Pred-Post: 75 %
FEV6-%Pred-Pre: 74 %
FEV6-Post: 2.21 L
FEV6-Pre: 2.18 L
FEV6FVC-%Pred-Post: 107 %
FEV6FVC-%Pred-Pre: 107 %
FVC-%Change-Post: 0 %
FVC-%Pred-Post: 70 %
FVC-%Pred-Pre: 69 %
FVC-Post: 2.21 L
FVC-Pre: 2.2 L
Post FEV1/FVC ratio: 80 %
Post FEV6/FVC ratio: 100 %
Pre FEV1/FVC ratio: 75 %
Pre FEV6/FVC Ratio: 100 %
RV % pred: 131 %
RV: 2.74 L
TLC % pred: 91 %
TLC: 5.04 L

## 2023-01-20 NOTE — Progress Notes (Signed)
Full PFT Performed Today  

## 2023-01-20 NOTE — Patient Instructions (Signed)
Full PFT Performed Today  

## 2023-01-20 NOTE — Addendum Note (Signed)
Addended by: Janann Colonel on: 01/20/2023 04:06 PM   Modules accepted: Orders

## 2023-01-23 ENCOUNTER — Telehealth: Payer: Self-pay | Admitting: Pulmonary Disease

## 2023-01-23 ENCOUNTER — Encounter: Payer: Self-pay | Admitting: Pulmonary Disease

## 2023-01-23 NOTE — Telephone Encounter (Signed)
According to referral note- referral has been closed due to patient refusing services.  Nothing further needed.

## 2023-01-23 NOTE — Telephone Encounter (Signed)
Pt returning Dr. Candis Musa call

## 2023-01-23 NOTE — Telephone Encounter (Signed)
See patient message from today(11/21).  Nothing further needed.

## 2023-01-23 NOTE — Progress Notes (Signed)
Scheduler instructed to close referral. I sent an IB to pt's Pulmonologist, Dr Larinda Buttery, that if pts pathology comes back as malignant, a new referral can be placed.

## 2023-01-24 MED ORDER — BUDESONIDE-FORMOTEROL FUMARATE 160-4.5 MCG/ACT IN AERO: 2.0000 | INHALATION_SPRAY | Freq: Two times a day (BID) | RESPIRATORY_TRACT | 12 refills | Status: DC

## 2023-01-24 MED ORDER — SPACER/AERO-HOLDING CHAMBERS DEVI
0 refills | Status: AC
Start: 2023-01-24 — End: ?

## 2023-01-24 NOTE — Addendum Note (Signed)
Addended by: Janann Colonel on: 01/24/2023 02:12 PM   Modules accepted: Orders

## 2023-01-24 NOTE — Telephone Encounter (Signed)
Per Dr. Larinda Buttery, he has called the patient and answered his questions.  Nothing further needed.

## 2023-02-03 ENCOUNTER — Encounter: Payer: Self-pay | Admitting: Podiatry

## 2023-02-03 ENCOUNTER — Ambulatory Visit: Payer: Medicare PPO | Admitting: Podiatry

## 2023-02-03 DIAGNOSIS — B070 Plantar wart: Secondary | ICD-10-CM | POA: Diagnosis not present

## 2023-02-03 MED ORDER — IMIQUIMOD 5 % EX CREA: TOPICAL_CREAM | CUTANEOUS | 0 refills | Status: DC

## 2023-02-03 NOTE — Progress Notes (Unsigned)
Subjective: Chief Complaint  Patient presents with   Plantar Warts    RM#11 Left foot follow up on treatment     72 year old male presents the office with above concerns.  He thinks that the area is getting better and the laser is helping. He has been using Efudex cream more consistently.  Previously he was not using it as consistently but since doing so has also been improving.  Pain is also improving feels better with walking.   Objective: AAO x3, NAD DP/PT pulses palpable bilaterally, CRT less than 3 seconds Hyperkeratotic lesion noted left foot which appears to be superficial with minimal callus compared to what it was previously.  Patient more superficial.  No surrounding erythema, ascending size.  No drainage or pus or signs of infection. No pain with calf compression, swelling, warmth, erythema   Assessment: Verruca left foot, skin lesion  Plan: -All treatment options discussed with the patient including all alternatives, risks, complications.  -Following standard precautions and using eye protection I did laser the warts without any complications.   -Prescribed Aldara cream. -Monitor for any clinical signs or symptoms of infection and directed to call the office immediately should any occur or go to the ER.  Vivi Barrack DPM

## 2023-02-06 ENCOUNTER — Encounter: Payer: Self-pay | Admitting: Pulmonary Disease

## 2023-02-06 ENCOUNTER — Ambulatory Visit: Payer: Medicare PPO | Admitting: Pulmonary Disease

## 2023-02-06 ENCOUNTER — Other Ambulatory Visit
Admission: RE | Admit: 2023-02-06 | Discharge: 2023-02-06 | Disposition: A | Discharge status: Home / Self Care | Payer: Medicare PPO | Source: Ambulatory Visit | Attending: Pulmonary Disease | Admitting: Pulmonary Disease

## 2023-02-06 VITALS — BP 120/78 | HR 55 | Temp 97.6°F | Ht 62.5 in | Wt 150.0 lb

## 2023-02-06 DIAGNOSIS — J4530 Mild persistent asthma, uncomplicated: Secondary | ICD-10-CM

## 2023-02-06 DIAGNOSIS — R0602 Shortness of breath: Secondary | ICD-10-CM

## 2023-02-06 LAB — NITRIC OXIDE: Nitric Oxide: 46

## 2023-02-06 LAB — EOSINOPHIL COUNT: Eosinophils Absolute: 0.2 10*3/uL (ref 0.0–0.5)

## 2023-02-06 MED ORDER — BUDESONIDE-FORMOTEROL FUMARATE 160-4.5 MCG/ACT IN AERO: 2.0000 | INHALATION_SPRAY | Freq: Two times a day (BID) | RESPIRATORY_TRACT | 6 refills | Status: DC

## 2023-02-06 MED ORDER — FLUTICASONE PROPIONATE 50 MCG/ACT NA SUSP: 1.0000 | Freq: Two times a day (BID) | NASAL | 2 refills | Status: DC

## 2023-02-06 NOTE — Progress Notes (Signed)
Synopsis: Referred in by Ryan Patience, [name]   Subjective:   PATIENT ID: Ryan [name] GENDER: male DOB: March 19, 1950, MRN: [id number]  Chief Complaint  Patient presents with   Follow-up    Shortness of breath on exertion. No cough or wheezing.     HPI Ryan [name] is a pleasant 72 year old male patient with a past medical history of ankylosing spondylitis on Humira, hyperlipidemia depression and COPD not on any inhalers presenting for evaluation of a pulmonary nodule.  He is enrolled in the lung cancer screening program and underwent a CT chest on 08/30/2022 that showed a 6.7 mm left upper lobe solid nodule behind the aortic arch.  Therefore he had a repeat CT chest in 3 months.  His CT chest was on 12/2022 and showed an 8.6 mm new left upper lobe nodule.  He was referred to [place] for further evaluation.  His repeat CT chest on 01-16-2023 showed the LUL nodule resolved. But did show a right upper lobe new ndule measuring 12mm with a tree in bud pattern.   PFTs 01/20/2023 suggestive of asthma,   He denies any symptoms, one of the symptoms are chronic which are dyspnea on exertion and mucous production with possible chronic rhisinusitis.  He is active swims 15 laps.  He was just in [place] on vacation and was able to walk around without limitations.  He denies any loss of appetite and no significant weight changes.  Family history -bladder cancer in his mother who was a smoker  Social history-active smoker unclear amount but smoked cigarillos for the past 10 years.   ROS All systems were reviewed and are negative except for the above. Objective:   Vitals:   02/06/23 1052  BP: 120/78  Pulse: (!) 55  Temp: 97.6 F (36.4 C)  TempSrc: Temporal  SpO2: 96%  Weight: 150 lb (68 kg)  Height: 5' 2.5" (1.588 m)   96% on RA BMI Readings from Last 3 Encounters:  02/06/23 27.00 kg/m  01/20/23 26.96 kg/m  01/07/23 25.97 kg/m   Wt Readings from Last 3 Encounters:  02/06/23 150 lb (68 kg)   01/20/23 149 lb 12.8 oz (67.9 kg)  01/07/23 146 lb 9.6 oz (66.5 kg)    Physical Exam GEN: NAD, Healthy Appearing HEENT: Supple Neck, Reactive Pupils, EOMI  CVS: Normal S1, Normal S2, RRR, No murmurs or ES appreciated  Lungs: Clear bilateral air entry.  Abdomen: Soft, non tender, non distended, + BS  Extremities: Warm and well perfused, No edema  Skin: No suspicious lesions appreciated  Psych: Normal Affect  Labs and imaging were reviewed.  Ancillary Information   CBC    Component Value Date/Time   WBC 8.0 11/17/2017 1032   WBC 9.4 12/21/2015 0038   RBC 4.65 11/17/2017 1032   RBC 4.45 12/21/2015 0038   HGB 15.5 11/17/2017 1032   HCT 46.0 11/17/2017 1032   PLT 199 11/17/2017 1032   MCV 99 (H) 11/17/2017 1032   MCH 33.3 (H) 11/17/2017 1032   MCH 33.3 12/21/2015 0038   MCHC 33.7 11/17/2017 1032   MCHC 33.6 12/21/2015 0038   RDW 13.9 11/17/2017 1032   Labs and imaging were reviewed.     Latest Ref Rng & Units 01/20/2023    2:06 PM 11/22/2015    3:57 PM  PFT Results  FVC-Pre L 2.20  2.57   FVC-Predicted Pre % 69  71   FVC-Post L 2.21  2.44   FVC-Predicted Post % 70  67  Pre FEV1/FVC % % 75  75   Post FEV1/FCV % % 80  79   FEV1-Pre L 1.65  1.93   FEV1-Predicted Pre % 72  71   FEV1-Post L 1.77  1.93   DLCO uncorrected ml/min/mmHg 16.74  16.81   DLCO UNC% % 83  69   DLCO corrected ml/min/mmHg 16.74  17.22   DLCO COR %Predicted % 83  70   DLVA Predicted % 106  98   TLC L 5.04  4.46   TLC % Predicted % 91  76   RV % Predicted % 131  74      Assessment & Plan:  [name] is a pleasant 72 year old male patient with a past medical history of ankylosing spondylitis on Humira, hyperlipidemia depression and COPD not on any inhalers presenting for evaluation of a pulmonary nodule.  #Mild persistent asthma  PFTs with air trapping, ventilatory defect and normal DLCO. Suggestive of asthma.   []  C/w budesonide-formoterol [Symbicort] 160-4.5 2 puffs bid.  []  c/w Albuterol  as needed  []  EOS and Allergen panel.   #Chronic rhinosinusitis with post nasal drip  []  Fluticasone nasal spray 1 puff each nostril BID.   #RUL nodule with tree in bud pattern  Likely an infectious process such as NTM. Possible mucous plugging. Less likely malignant given development in less than 1 month.   []  Repeat CT chest in 6 months and if reassuring can go back to yearly LDCT.    Return in about 6 months (around 08/07/2023) for with the ct chest.  I spent 35 minutes caring for this patient today, including preparing to see the patient, obtaining a medical history , reviewing a separately obtained history, performing a medically appropriate examination and/or evaluation, counseling and educating the patient/family/caregiver, ordering medications, tests, or procedures, documenting clinical information in the electronic health record, and independently interpreting results (not separately reported/billed) and communicating results to the patient/family/caregiver  Janann Colonel, MD [facility] 02/06/2023 12:28 PM

## 2023-02-09 LAB — ALLERGEN PANEL (27) + IGE
Alternaria Alternata IgE: <0.1 kU/L
Aspergillus Fumigatus IgE: <0.1 kU/L
Bahia Grass IgE: 0.2 kU/L — AB
Bermuda Grass IgE: 0.21 kU/L — AB
Cat Dander IgE: 0.88 kU/L — AB
Cedar, Mountain IgE: 0.18 kU/L — AB
Cladosporium Herbarum IgE: <0.1 kU/L
Cocklebur IgE: 0.15 kU/L — AB
Cockroach, American IgE: <0.1 kU/L
Common Silver Birch IgE: <0.1 kU/L
D Farinae IgE: <0.1 kU/L
D Pteronyssinus IgE: <0.1 kU/L
Dog Dander IgE: 0.49 kU/L — AB
Elm, American IgE: 0.19 kU/L — AB
Hickory, White IgE: 0.15 kU/L — AB
IgE (Immunoglobulin E), Serum: 316 [IU]/mL (ref 6–495)
Johnson Grass IgE: 0.24 kU/L — AB
Kentucky Bluegrass IgE: 0.3 kU/L — AB
Maple/Box Elder IgE: 0.21 kU/L — AB
Mucor Racemosus IgE: 0.11 kU/L — AB
Oak, White IgE: 0.19 kU/L — AB
Penicillium Chrysogen IgE: <0.1 kU/L
Pigweed, Rough IgE: 0.15 kU/L — AB
Plantain, English IgE: 0.16 kU/L — AB
Ragweed, Short IgE: 0.19 kU/L — AB
Setomelanomma Rostrat: <0.1 kU/L
Timothy Grass IgE: 0.26 kU/L — AB
White Mulberry IgE: <0.1 kU/L

## 2023-02-17 DIAGNOSIS — Z125 Encounter for screening for malignant neoplasm of prostate: Secondary | ICD-10-CM | POA: Diagnosis not present

## 2023-03-03 ENCOUNTER — Ambulatory Visit (INDEPENDENT_AMBULATORY_CARE_PROVIDER_SITE_OTHER): Payer: Medicare PPO | Admitting: Podiatry

## 2023-03-03 DIAGNOSIS — B070 Plantar wart: Secondary | ICD-10-CM | POA: Diagnosis not present

## 2023-03-03 DIAGNOSIS — N5201 Erectile dysfunction due to arterial insufficiency: Secondary | ICD-10-CM | POA: Diagnosis not present

## 2023-03-03 DIAGNOSIS — N401 Enlarged prostate with lower urinary tract symptoms: Secondary | ICD-10-CM | POA: Diagnosis not present

## 2023-03-03 DIAGNOSIS — N200 Calculus of kidney: Secondary | ICD-10-CM | POA: Diagnosis not present

## 2023-03-03 DIAGNOSIS — N281 Cyst of kidney, acquired: Secondary | ICD-10-CM | POA: Diagnosis not present

## 2023-03-03 DIAGNOSIS — R351 Nocturia: Secondary | ICD-10-CM | POA: Diagnosis not present

## 2023-03-03 MED ORDER — IMIQUIMOD 5 % EX CREA: TOPICAL_CREAM | CUTANEOUS | 0 refills | Status: DC

## 2023-03-03 NOTE — Progress Notes (Signed)
Subjective: Chief Complaint  Patient presents with   Plantar Warts    Follow up for plantar wart on LT plantar. Laser treatments are working. The Aldara is helping as well.     72 year old male presents the office with above concerns.  States he is doing much better the status it is the best it has looked. He states that the Aldara cream has been very effective.   Objective: AAO x3, NAD DP/PT pulses palpable bilaterally, CRT less than 3 seconds There is mild hyperkeratotic lesion noted left foot which appears to be superficial.overall looks much better compared to what had been previously.  No pain.  No swelling redness or drainage.  No open lesions. No pain with calf compression, swelling, warmth, erythema   Assessment: Verruca left foot, skin lesion-improving  Plan: -All treatment options discussed with the patient including all alternatives, risks, complications.  -Held off on laser today.  Sharply debrided hyperkeratotic tissue any complications.  Continue Aldara cream which I refilled today.  Offloading. -Monitor for any clinical signs or symptoms of infection and directed to call the office immediately should any occur or go to the ER.  Vivi Barrack DPM

## 2023-03-03 NOTE — Patient Instructions (Signed)
Imiquimod Cream What is this medication? IMIQUIMOD (i mi KWI mod) treats rough, scaly spots on the skin caused by sun exposure. It may also treat warts on and around the genital and rectal areas caused by a virus. It does not kill the virus and it may still be possible to spread the virus to others. It will not treat infections caused by bacteria. This medicine may be used for other purposes; ask your health care provider or pharmacist if you have questions. COMMON BRAND NAME(S): Aldara, Zyclara What should I tell my care team before I take this medication? They need to know if you have any of these conditions: Immune system problems Large areas of burned or damaged skin An unusual or allergic reaction to imiquimod, other medications, foods, dyes, or preservatives Pregnant or trying to get pregnant Breast-feeding How should I use this medication? This medication is for external use only. Do not take by mouth. Wash hands before and after use. Do not get it in your eyes. If you do, rinse your eyes with plenty of cool tap water. Use it as directed on the prescription label. Do not use it more often than directed. Use the medication for the full course as directed by your care team, even if you think you are better. Do not stop using it unless your care team tells you to stop it early. Apply a thin film of medication to the affected area. Talk to your care team about the use of this medication in children. While it may be prescribed for children as young as 12 years for selected conditions, precautions do apply. Overdosage: If you think you have taken too much of this medicine contact a poison control center or emergency room at once. NOTE: This medicine is only for you. Do not share this medicine with others. What if I miss a dose? If you miss a dose, use it as soon as you can. If it is almost time for your next dose, use only that dose. Do not use double or extra doses. What may interact with this  medication? Interactions are not expected. Do not use any other skin products on the same area of skin without talking to your care team. This list may not describe all possible interactions. Give your health care provider a list of all the medicines, herbs, non-prescription drugs, or dietary supplements you use. Also tell them if you smoke, drink alcohol, or use illegal drugs. Some items may interact with your medicine. What should I watch for while using this medication? Visit your care team for regular checks on your progress. Tell your care team if your symptoms do not start to get better or if they get worse. If you are being treated for a sexually transmitted infection (STI), avoid sexual contact until you have finished your treatment. Your partner may also need treatment. This medication can damage and reduce the effect of latex-containing products, such as condoms and diaphragms. Avoid contact of this medication with latex-containing products; throw away any products that are exposed to this medication. This medication can make you more sensitive to the sun. Keep out of the sun. If you cannot avoid being in the sun, wear protective clothing and sunscreen. Do not use sun lamps or tanning beds/booths. What side effects may I notice from receiving this medication? Side effects that you should report to your care team as soon as possible: Allergic reactions--skin rash, itching, hives, swelling of the face, lips, tongue, or throat Burning, itching, crusting,  or peeling of treated skin Flu-like symptoms--fever, chills, muscle pain, cough, headache, fatigue Vaginal irritation at application site Painful swelling, warmth, or redness of the skin, blisters or sores Side effects that usually do not require medical attention (report to your care team if they continue or are bothersome): Change in skin color Mild skin irritation, redness, or dryness This list may not describe all possible side effects.  Call your doctor for medical advice about side effects. You may report side effects to FDA at 1-800-FDA-1088. Where should I keep my medication? Keep out of the reach of children and pets. See product for storage information. Each product may have different instructions. Get rid of any unused medication after the expiration date. To get rid of medications that are no longer needed or have expired: Take the medication to a medication take-back program. Check with your pharmacy or law enforcement to find a location. If you cannot return the medication, check the label or package insert to see if the medication should be thrown out in the garbage or flushed down the toilet. If you are not sure, ask your care team. If it is safe to put it in the trash, empty the medication out of the container. Mix the medication with cat litter, dirt, coffee grounds, or other unwanted substance. Seal the mixture in a bag or container. Put it in the trash. NOTE: This sheet is a summary. It may not cover all possible information. If you have questions about this medicine, talk to your doctor, pharmacist, or health care provider.  2024 Elsevier/Gold Standard (2021-06-12 00:00:00)   Plantar Warts Warts are small growths on the skin. When they happen on the bottom of the foot (sole), they are called plantar warts. Most warts are not painful and do not cause problems. In some cases, plantar warts may cause pain when you walk. They can also spread to other parts of your body. Warts often go away on their own. Treatment may be done if needed. What are the causes? Plantar warts are caused by a germ called human papillomavirus (HPV). You may get HPV if: You walk barefoot. The risk is higher if your feet are wet. You have a break in the skin of your foot. What increases the risk? Being between 85 and 29 years of age. Using public showers or locker rooms. Having a weak body defense system (immune system). What are the signs  or symptoms?  Flat or slightly raised growths. They may have a rough surface. They may look like a callus. Pain when you stand or walk on your foot. How is this treated? In many cases, warts do not need treatment. They may go away on their own with time. If treatment is needed or wanted, it may include: Putting solutions, creams, or patches with medicine in them on the wart. Freezing the wart with liquid nitrogen. Burning the wart with: Laser treatment. An electrified probe. Putting a medicine into the wart to help your immune system fight off the wart. Having surgery to remove the wart. Putting duct tape over the top of the wart. You will leave the tape in place for as long as told by your doctor. Then you will replace it with a new strip of tape. This is done until the wart goes away. You may need repeat treatment. In some cases, warts may go away and come back again. Follow these instructions at home: General instructions Put on creams or solutions only as told by your doctor. If told  by your doctor: Soak your foot in warm water. Remove the top layer of softened skin before you put the medicine on. You can use a pumice stone to remove the skin. After you put the medicine on, put a bandage over the area of the wart. Repeat the process every day or as told by your doctor. Do not scratch or pick at a wart. Wash your hands after you touch a wart. If a wart hurts, cover it with a bandage that has a hole in the middle. Keep all follow-up visits. You may need some treatments more than once. How is this prevented?  Wear shoes and socks. Change your socks every day. Keep your feet clean and dry. Do not walk barefoot in: Trent rooms. Shower areas. Swimming pools. Check your feet often. Avoid direct contact with warts on other people. Contact a doctor if: Your warts do not get better with treatment. You have redness, swelling, or pain at the site of a wart. You have bleeding from a  wart that does not stop when you put light pressure on the wart. You have diabetes and you get a wart. This information is not intended to replace advice given to you by your health care provider. Make sure you discuss any questions you have with your health care provider. Document Revised: 03/05/2022 Document Reviewed: 03/05/2022 Elsevier Patient Education  2024 [employer].

## 2023-03-07 ENCOUNTER — Other Ambulatory Visit (HOSPITAL_COMMUNITY): Payer: Medicare PPO

## 2023-03-17 ENCOUNTER — Other Ambulatory Visit (HOSPITAL_COMMUNITY): Payer: Medicare PPO

## 2023-05-02 ENCOUNTER — Encounter: Payer: Self-pay | Admitting: Podiatry

## 2023-05-02 ENCOUNTER — Ambulatory Visit: Payer: Medicare PPO | Admitting: Podiatry

## 2023-05-02 DIAGNOSIS — D2372 Other benign neoplasm of skin of left lower limb, including hip: Secondary | ICD-10-CM

## 2023-05-02 NOTE — Progress Notes (Signed)
 Subjective: Chief Complaint  Patient presents with   Plantar Warts    Wart on left foot pt says laser treatment made a difference. Denies any pain, says he can feel when he walks. Wants to know if he should continue topical medication prescribed.      73 year old male presents the office with above concerns.  States overall is doing much better.  Still feel the location does not cause any pain.  Recently cross-country skiing and he did well without any pain.  No open lesions.    Objective: AAO x3, NAD DP/PT pulses palpable bilaterally, CRT less than 3 seconds There is mild hyperkeratotic lesion noted left foot which appears to be superficial.overall looks much better compared to what had been previously.  Minimal callus formation there is still small superficial area of pinpoint bleeding with evidence of verruca.  No pain.  No swelling redness or drainage.  No open lesions. No pain with calf compression, swelling, warmth, erythema   Assessment: Verruca left foot, skin lesion-improving  Plan: -All treatment options discussed with the patient including all alternatives, risks, complications.  -Sharply debrided hyperkeratotic tissue any complications.  Cantharone was applied following this a bandage.  Postprocedure instructions discussed.  Monitor for any signs or symptoms of infection. -Recommend moisturizer.  If the wart still present we will likely refill the cream for treatment of laser treatment. -Monitor for any clinical signs or symptoms of infection and directed to call the office immediately should any occur or go to the ER.  Vivi Barrack DPM

## 2023-05-02 NOTE — Patient Instructions (Signed)
 Take dressing off in 8 hours and wash the foot with soap and water. If it is hurting or becomes uncomfortable before the 8 hours, go ahead and remove the bandage and wash the area.  If it blisters, apply antibiotic ointment and a band-aid.  Monitor for any signs/symptoms of infection. Call the office immediately if any occur or go directly to the emergency room. Call with any questions/concerns.

## 2023-05-06 ENCOUNTER — Ambulatory Visit (HOSPITAL_COMMUNITY): Payer: Medicare PPO | Attending: Cardiovascular Disease

## 2023-05-06 DIAGNOSIS — I351 Nonrheumatic aortic (valve) insufficiency: Secondary | ICD-10-CM | POA: Diagnosis not present

## 2023-05-06 DIAGNOSIS — I7121 Aneurysm of the ascending aorta, without rupture: Secondary | ICD-10-CM | POA: Insufficient documentation

## 2023-05-06 LAB — ECHOCARDIOGRAM COMPLETE
AR max vel: 1.32 cm2
AV Area VTI: 1.42 cm2
AV Area mean vel: 1.27 cm2
AV Mean grad: 20 mmHg
AV Peak grad: 36.7 mmHg
Ao pk vel: 3.03 m/s
Area-P 1/2: 3.21 cm2
P 1/2 time: 534 ms
S' Lateral: 2.6 cm

## 2023-05-07 DIAGNOSIS — M79641 Pain in right hand: Secondary | ICD-10-CM | POA: Diagnosis not present

## 2023-05-07 DIAGNOSIS — M65342 Trigger finger, left ring finger: Secondary | ICD-10-CM | POA: Diagnosis not present

## 2023-05-07 DIAGNOSIS — M79642 Pain in left hand: Secondary | ICD-10-CM | POA: Diagnosis not present

## 2023-05-08 DIAGNOSIS — H401131 Primary open-angle glaucoma, bilateral, mild stage: Secondary | ICD-10-CM | POA: Diagnosis not present

## 2023-05-11 NOTE — Progress Notes (Unsigned)
 Cardiology Office Note:    Date:  05/11/2023   ID:  Ryan [name], DOB Jan 23, 1951, MRN [id number]  PCP:  Camie Patience, [name]   [facility] Providers Cardiologist:  Tonny Bollman, MD     Referring MD: Camie Patience, [name]   No chief complaint on file.   History of Present Illness:    Ryan [name] is a 73 y.o. male with a hx of aortic valve disease, presenting for follow-up evaluation.  The patient underwent bioprosthetic aortic valve replacement in 2011.  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  Recent echocardiogram shows increase in his mean transaortic gradient now at 20 mmHg with stable, moderate aortic insufficiency.  The LVEF remains normal at 60 to 65% with normal RV function.  Current Medications: No outpatient medications have been marked as taking for the 05/12/23 encounter (Appointment) with Tonny Bollman, MD.     Allergies:   Azithromycin   ROS:   Please see the history of present illness.    *** All other systems reviewed and are negative.  EKGs/Labs/Other Studies Reviewed:    The following studies were reviewed today: Cardiac Studies & Procedures   ______________________________________________________________________________________________     ECHOCARDIOGRAM  ECHOCARDIOGRAM COMPLETE 05/06/2023  Narrative ECHOCARDIOGRAM REPORT    Patient Name:   Ryan [name]  Date of Exam: 05/06/2023 Medical Rec #:  [id number]     Height:       62.5 in Accession #:    [id number]    Weight:       150.0 lb Date of Birth:  Mar 11, 1950    BSA:          1.702 m Patient Age:    72 years      BP:           120/68 mmHg Patient Gender: M             HR:           53 bpm. Exam Location:  [address]  Procedure: 2D Echo, Cardiac Doppler, Color Doppler and Strain Analysis (Both Spectral and Color Flow Doppler were utilized during procedure).  Indications:    I35.1  AI  History:        Patient has prior history of Echocardiogram examinations, most recent 03/05/2022. Dilated ascending aorta, AVR (bioprostetic), Arrythmias:Atrial Fibrillation, Signs/Symptoms:AI; Risk Factors:Hypertension and Former Smoker. Aortic Valve: bioprosthetic valve is present in the aortic position. Procedure Date: 09/21/09.  Sonographer:    Samule Ohm RDCS Referring Phys: [phone] Samik Balkcom  IMPRESSIONS   1. S/P AVR with mean gradient 20 mmHg and moderate AI; compared to 03/05/22 mean gradient has increased from 11.5 mmHg. 2. Left ventricular ejection fraction, by estimation, is 60 to 65%. The left ventricle has normal function. The left ventricle has no regional wall motion abnormalities. There is mild concentric left ventricular hypertrophy. Left ventricular diastolic parameters are consistent with Grade I diastolic dysfunction (impaired relaxation). The average left ventricular global longitudinal strain is -22.6 %. The global longitudinal strain is normal. 3. Right ventricular systolic function is normal. The right ventricular size is normal. 4. The mitral valve is normal in structure. Trivial mitral valve regurgitation. No evidence of mitral stenosis. 5. The aortic valve has been repaired/replaced. Aortic valve regurgitation is moderate. There is a bioprosthetic valve present in the aortic position. Procedure Date: 09/21/09. 6. Aortic dilatation noted. There is borderline dilatation of the aortic  root, measuring 38 mm. There is moderate dilatation of the ascending aorta, measuring 48 mm. 7. The inferior vena cava is normal in size with greater than 50% respiratory variability, suggesting right atrial pressure of 3 mmHg.  FINDINGS Left Ventricle: Left ventricular ejection fraction, by estimation, is 60 to 65%. The left ventricle has normal function. The left ventricle has no regional wall motion abnormalities. The average left ventricular global longitudinal strain is -22.6  %. Strain was performed and the global longitudinal strain is normal. The left ventricular internal cavity size was normal in size. There is mild concentric left ventricular hypertrophy. Left ventricular diastolic parameters are consistent with Grade I diastolic dysfunction (impaired relaxation).  Right Ventricle: The right ventricular size is normal. Right ventricular systolic function is normal.  Left Atrium: Left atrial size was normal in size.  Right Atrium: Right atrial size was normal in size.  Pericardium: There is no evidence of pericardial effusion.  Mitral Valve: The mitral valve is normal in structure. Trivial mitral valve regurgitation. No evidence of mitral valve stenosis.  Tricuspid Valve: The tricuspid valve is normal in structure. Tricuspid valve regurgitation is mild . No evidence of tricuspid stenosis.  Aortic Valve: The aortic valve has been repaired/replaced. Aortic valve regurgitation is moderate. Aortic regurgitation PHT measures 534 msec. Aortic valve mean gradient measures 20.0 mmHg. Aortic valve peak gradient measures 36.7 mmHg. Aortic valve area, by VTI measures 1.42 cm. There is a bioprosthetic valve present in the aortic position. Procedure Date: 09/21/09.  Pulmonic Valve: The pulmonic valve was normal in structure. Pulmonic valve regurgitation is trivial. No evidence of pulmonic stenosis.  Aorta: Aortic dilatation noted. There is borderline dilatation of the aortic root, measuring 38 mm. There is moderate dilatation of the ascending aorta, measuring 48 mm.  Venous: The inferior vena cava is normal in size with greater than 50% respiratory variability, suggesting right atrial pressure of 3 mmHg.  IAS/Shunts: No atrial level shunt detected by color flow Doppler.  Additional Comments: S/P AVR with mean gradient 20 mmHg and moderate AI; compared to 03/05/22 mean gradient has increased from 11.5 mmHg.   LEFT VENTRICLE PLAX 2D LVIDd:         4.20 cm    Diastology LVIDs:         2.60 cm   LV e' medial:    6.74 cm/s LV PW:         1.30 cm   LV E/e' medial:  12.5 LV IVS:        1.30 cm   LV e' lateral:   6.42 cm/s LVOT diam:     2.20 cm   LV E/e' lateral: 13.1 LV SV:         100 LV SV Index:   59        2D Longitudinal Strain LVOT Area:     3.80 cm  2D Strain GLS (A4C):   -19.7 % 2D Strain GLS (A3C):   -25.2 % 2D Strain GLS (A2C):   -23.0 % 2D Strain GLS Avg:     -22.6 %  RIGHT VENTRICLE             IVC RV S prime:     13.80 cm/s  IVC diam: 1.20 cm TAPSE (M-mode): 2.0 cm RVSP:           24.7 mmHg  LEFT ATRIUM             Index        RIGHT ATRIUM  Index LA diam:        3.10 cm 1.82 cm/m   RA Pressure: 3.00 mmHg LA Vol (A2C):   46.5 ml 27.32 ml/m  RA Area:     12.20 cm LA Vol (A4C):   52.4 ml 30.79 ml/m  RA Volume:   25.90 ml  15.22 ml/m LA Biplane Vol: 51.4 ml 30.20 ml/m AORTIC VALVE AV Area (Vmax):    1.32 cm AV Area (Vmean):   1.27 cm AV Area (VTI):     1.42 cm AV Vmax:           303.00 cm/s AV Vmean:          205.000 cm/s AV VTI:            0.709 m AV Peak Grad:      36.7 mmHg AV Mean Grad:      20.0 mmHg LVOT Vmax:         105.00 cm/s LVOT Vmean:        68.600 cm/s LVOT VTI:          0.264 m LVOT/AV VTI ratio: 0.37 AI PHT:            534 msec  AORTA Ao Root diam: 3.80 cm Ao Asc diam:  4.80 cm  MITRAL VALVE               TRICUSPID VALVE MV Area (PHT): 3.21 cm    TR Peak grad:   21.7 mmHg MV Decel Time: 236 msec    TR Vmax:        233.00 cm/s MV E velocity: 84.40 cm/s  Estimated RAP:  3.00 mmHg MV A velocity: 90.70 cm/s  RVSP:           24.7 mmHg MV E/A ratio:  0.93 SHUNTS Systemic VTI:  0.26 m Systemic Diam: 2.20 cm  Olga Millers MD Electronically signed by Olga Millers MD Signature Date/Time: 05/06/2023/1:56:42 PM    Final          ______________________________________________________________________________________________      EKG:        Recent Labs: 08/23/2022:  Creatinine, Ser 1.40  Recent Lipid Panel    Component Value Date/Time   CHOL 138 11/28/2017 0917   TRIG 60 11/28/2017 0917   HDL 52 11/28/2017 0917   CHOLHDL 2.7 11/28/2017 0917   LDLCALC 74 11/28/2017 0917     Risk Assessment/Calculations:   {Does this patient have ATRIAL FIBRILLATION?[phone]}  No BP recorded.  {Refresh Note OR Click here to enter BP  :1}***         Physical Exam:    VS:  There were no vitals taken for this visit.    Wt Readings from Last 3 Encounters:  02/06/23 150 lb (68 kg)  01/20/23 149 lb 12.8 oz (67.9 kg)  01/07/23 146 lb 9.6 oz (66.5 kg)     GEN: *** Well nourished, well developed in no acute distress HEENT: Normal NECK: No JVD; No carotid bruits LYMPHATICS: No lymphadenopathy CARDIAC: ***RRR, no murmurs, rubs, gallops RESPIRATORY:  Clear to auscultation without rales, wheezing or rhonchi  ABDOMEN: Soft, non-tender, non-distended MUSCULOSKELETAL:  No edema; No deformity  SKIN: Warm and dry NEUROLOGIC:  Alert and oriented x 3 PSYCHIATRIC:  Normal affect   Assessment & Plan Aneurysm of ascending aorta without rupture (HCC)  Essential hypertension, benign  Tobacco abuse  Pure hypercholesterolemia  Stenosis of prosthetic aortic valve, subsequent encounter        {Are you ordering a CV Procedure (  e.g. stress test, cath, DCCV, TEE, etc)?   Press F2        :[id number]}    Medication Adjustments/Labs and Tests Ordered: Current medicines are reviewed at length with the patient today.  Concerns regarding medicines are outlined above.  No orders of the defined types were placed in this encounter.  No orders of the defined types were placed in this encounter.   There are no Patient Instructions on file for this visit.   Signed, Tonny Bollman, MD  05/11/2023 6:03 PM    [facility]

## 2023-05-12 ENCOUNTER — Encounter: Payer: Self-pay | Admitting: Cardiovascular Disease

## 2023-05-12 ENCOUNTER — Ambulatory Visit: Payer: Medicare PPO | Attending: Cardiovascular Disease | Admitting: Cardiovascular Disease

## 2023-05-12 VITALS — BP 130/82 | HR 58 | Ht 62.0 in | Wt 148.4 lb

## 2023-05-12 DIAGNOSIS — Z720 Tobacco use: Secondary | ICD-10-CM

## 2023-05-12 DIAGNOSIS — Z01810 Encounter for preprocedural cardiovascular examination: Secondary | ICD-10-CM | POA: Diagnosis not present

## 2023-05-12 DIAGNOSIS — E7800 Pure hypercholesterolemia, unspecified: Secondary | ICD-10-CM

## 2023-05-12 DIAGNOSIS — I10 Essential (primary) hypertension: Secondary | ICD-10-CM

## 2023-05-12 DIAGNOSIS — T82857D Stenosis of cardiac prosthetic devices, implants and grafts, subsequent encounter: Secondary | ICD-10-CM | POA: Diagnosis not present

## 2023-05-12 DIAGNOSIS — I7121 Aneurysm of the ascending aorta, without rupture: Secondary | ICD-10-CM | POA: Diagnosis not present

## 2023-05-12 NOTE — Patient Instructions (Addendum)
 Lab Work: Nutritional therapist (in 6 months, prior to CTA scan) If you have labs (blood work) drawn today and your tests are completely normal, you will receive your results only by: MyChart Message (if you have MyChart) OR A paper copy in the mail If you have any lab test that is abnormal or we need to change your treatment, we will call you to review the results.  Testing/Procedures: ECHO Your physician has requested that you have an echocardiogram. Echocardiography is a painless test that uses sound waves to create images of your heart. It provides your doctor with information about the size and shape of your heart and how well your heart's chambers and valves are working. This procedure takes approximately one hour. There are no restrictions for this procedure. Please do NOT wear cologne, perfume, aftershave, or lotions (deodorant is allowed). Please arrive 15 minutes prior to your appointment time.  Please note: We ask at that you not bring children with you during ultrasound (echo/ vascular) testing. Due to room size and safety concerns, children are not allowed in the ultrasound rooms during exams. Our front office staff cannot provide observation of children in our lobby area while testing is being conducted. An adult accompanying a patient to their appointment will only be allowed in the ultrasound room at the discretion of the ultrasound technician under special circumstances. We apologize for any inconvenience.  CTA of chest CT Angiography (CTA), is a special type of CT scan that uses a computer to produce multi-dimensional views of major blood vessels throughout the body. In CT angiography, a contrast material is injected through an IV to help visualize the blood vessels  Follow-Up: At [facility], you and your health needs are our priority.  As part of our continuing mission to provide you with exceptional heart care, we have created designated Provider Care Teams.  These Care Teams include  your primary Cardiologist (physician) and Advanced Practice Providers (APPs -  Physician Assistants and Nurse Practitioners) who all work together to provide you with the care you need, when you need it.  Your next appointment:   6 month(s)  Provider:   Tonny Bollman, MD    Other Instructions   Your cardiac CT will be scheduled at:   [facility] [address] [place], [place] [address] [phone]  please arrive at the [facility] and Children's Entrance (Entrance C2) of [facility] 30 minutes prior to test start time. You can use the FREE valet parking offered at entrance C (encouraged to control the heart rate for the test)  Proceed to the [facility] Radiology Department (first floor) to check-in and test prep.  All radiology patients and guests should use entrance C2 at [facility], accessed from [facility], even though the hospital's physical address listed is [address].     Please follow these instructions carefully (unless otherwise directed):  An IV will be required for this test and Nitroglycerin will be given.  Hold all erectile dysfunction medications at least 3 days (72 hrs) prior to test. (Ie viagra, cialis, sildenafil, tadalafil, etc)   On the Night Before the Test: Be sure to Drink plenty of water. Do not consume any caffeinated/decaffeinated beverages or chocolate 12 hours prior to your test. Do not take any antihistamines 12 hours prior to your test.  On the Day of the Test: Drink plenty of water until 1 hour prior to the test. Do not eat any food 1 hour prior  to test. You may take your regular medications prior to the test.  Take metoprolol (Lopressor) two hours prior to test. If you take Furosemide/Hydrochlorothiazide/Spironolactone/Chlorthalidone, please HOLD on the morning of the test. Patients who wear a continuous glucose monitor MUST remove the device prior to scanning.      After the  Test: Drink plenty of water. After receiving IV contrast, you may experience a mild flushed feeling. This is normal. On occasion, you may experience a mild rash up to 24 hours after the test. This is not dangerous. If this occurs, you can take Benadryl 25 mg, Zyrtec, Claritin, or Allegra and increase your fluid intake. (Patients taking Tikosyn should avoid Benadryl, and may take Zyrtec, Claritin, or Allegra) If you experience trouble breathing, this can be serious. If it is severe call 911 IMMEDIATELY. If it is mild, please call our office.  We will call to schedule your test 2-4 weeks out understanding that some insurance companies will need an authorization prior to the service being performed.   For more information and frequently asked questions, please visit our website : [email or web address]  For non-scheduling related questions, please contact the cardiac imaging nurse navigator should you have any questions/concerns: Cardiac Imaging Nurse Navigators Direct Office Dial: [phone]   For scheduling needs, including cancellations and rescheduling, please call [place], [phone].

## 2023-05-12 NOTE — Assessment & Plan Note (Signed)
 Continue metoprolol succinate.

## 2023-05-12 NOTE — Assessment & Plan Note (Signed)
 Patient has had a stable ascending aortic aneurysm at 4.5 cm on CTA.  His echo and nongated CT scans have suggested some increase in size at 4.8 cm, but I wonder whether these are accurate measurements.  When I see him back in 6 months I will order a gated CTA of the chest prior to that visit.  He is having no chest pain symptoms and blood pressure is well-controlled.

## 2023-05-12 NOTE — Assessment & Plan Note (Signed)
 Continue rosuvastatin.  LDL cholesterol 68.

## 2023-05-15 DIAGNOSIS — M8580 Other specified disorders of bone density and structure, unspecified site: Secondary | ICD-10-CM | POA: Diagnosis not present

## 2023-05-15 DIAGNOSIS — M452 Ankylosing spondylitis of cervical region: Secondary | ICD-10-CM | POA: Diagnosis not present

## 2023-05-15 DIAGNOSIS — M25511 Pain in right shoulder: Secondary | ICD-10-CM | POA: Diagnosis not present

## 2023-05-15 DIAGNOSIS — M79605 Pain in left leg: Secondary | ICD-10-CM | POA: Diagnosis not present

## 2023-05-15 DIAGNOSIS — M79672 Pain in left foot: Secondary | ICD-10-CM | POA: Diagnosis not present

## 2023-05-15 DIAGNOSIS — M65342 Trigger finger, left ring finger: Secondary | ICD-10-CM | POA: Diagnosis not present

## 2023-05-15 DIAGNOSIS — M1A09X0 Idiopathic chronic gout, multiple sites, without tophus (tophi): Secondary | ICD-10-CM | POA: Diagnosis not present

## 2023-05-15 DIAGNOSIS — M1991 Primary osteoarthritis, unspecified site: Secondary | ICD-10-CM | POA: Diagnosis not present

## 2023-05-15 DIAGNOSIS — M25551 Pain in right hip: Secondary | ICD-10-CM | POA: Diagnosis not present

## 2023-06-09 DIAGNOSIS — Z822 Family history of deafness and hearing loss: Secondary | ICD-10-CM | POA: Diagnosis not present

## 2023-06-09 DIAGNOSIS — H9313 Tinnitus, bilateral: Secondary | ICD-10-CM | POA: Diagnosis not present

## 2023-06-09 DIAGNOSIS — H903 Sensorineural hearing loss, bilateral: Secondary | ICD-10-CM | POA: Diagnosis not present

## 2023-06-10 DIAGNOSIS — R490 Dysphonia: Secondary | ICD-10-CM | POA: Diagnosis not present

## 2023-06-11 DIAGNOSIS — H401131 Primary open-angle glaucoma, bilateral, mild stage: Secondary | ICD-10-CM | POA: Diagnosis not present

## 2023-06-25 ENCOUNTER — Encounter: Payer: Self-pay | Admitting: Pulmonary Disease

## 2023-06-26 NOTE — Telephone Encounter (Signed)
 Please advise on Allergy test that was done on 12/5.

## 2023-07-09 NOTE — Telephone Encounter (Signed)
 I have left a message asking the patient to call me to scheduled this CT

## 2023-07-11 NOTE — Telephone Encounter (Signed)
 Peggy with [employer] has spoke with the patient and his CT has been scheduled on 09/09/23 @ [facility]

## 2023-08-26 ENCOUNTER — Ambulatory Visit: Payer: Medicare PPO | Admitting: Pulmonary Disease

## 2023-09-01 ENCOUNTER — Other Ambulatory Visit: Payer: Self-pay | Admitting: Cardiovascular Disease

## 2023-09-01 ENCOUNTER — Telehealth: Payer: Self-pay | Admitting: Cardiovascular Disease

## 2023-09-01 NOTE — Telephone Encounter (Signed)
 Patient needs to have CT Angio scheduled prior to appt with Dr. Wonda. He states that he spoke with someone who schedules that test and they told him that they were so booked out that they could not schedule him right now. He is very frustrated and wants to know a time frame of when he should schedule his follow up with Dr. Wonda. He would like to cancel his echo appointment so that it is closer to his appt with Dr. Wonda once that gets scheduled as well as cancel the appointment with Raphael Bring, PA. This is an FYI, I will try to follow up with him tomorrow on a time frame for scheduling his CT and his f/u with Dr. Wonda.

## 2023-09-02 NOTE — Telephone Encounter (Signed)
 ECHO scheduled for 10/07/23. CTA is on 11/05/23, scheduled with Wonda on 11/24/23

## 2023-09-08 DIAGNOSIS — M65342 Trigger finger, left ring finger: Secondary | ICD-10-CM | POA: Diagnosis not present

## 2023-09-09 ENCOUNTER — Ambulatory Visit (HOSPITAL_BASED_OUTPATIENT_CLINIC_OR_DEPARTMENT_OTHER)
Admission: RE | Admit: 2023-09-09 | Discharge: 2023-09-09 | Disposition: A | Discharge status: Home / Self Care | Source: Ambulatory Visit | Attending: Pulmonary Disease | Admitting: Pulmonary Disease

## 2023-09-09 DIAGNOSIS — R911 Solitary pulmonary nodule: Secondary | ICD-10-CM | POA: Diagnosis not present

## 2023-09-09 DIAGNOSIS — I7121 Aneurysm of the ascending aorta, without rupture: Secondary | ICD-10-CM | POA: Diagnosis not present

## 2023-09-09 DIAGNOSIS — R0602 Shortness of breath: Secondary | ICD-10-CM | POA: Diagnosis not present

## 2023-09-09 DIAGNOSIS — S2232XA Fracture of one rib, left side, initial encounter for closed fracture: Secondary | ICD-10-CM | POA: Diagnosis not present

## 2023-09-16 ENCOUNTER — Encounter: Payer: Self-pay | Admitting: Pulmonary Disease

## 2023-09-16 ENCOUNTER — Ambulatory Visit: Admitting: Pulmonary Disease

## 2023-09-16 VITALS — BP 130/70 | HR 55 | Temp 98.0°F | Ht 62.0 in | Wt 148.8 lb

## 2023-09-16 DIAGNOSIS — J4520 Mild intermittent asthma, uncomplicated: Secondary | ICD-10-CM | POA: Diagnosis not present

## 2023-09-16 DIAGNOSIS — F17210 Nicotine dependence, cigarettes, uncomplicated: Secondary | ICD-10-CM | POA: Diagnosis not present

## 2023-09-16 NOTE — Progress Notes (Signed)
 Synopsis: Referred in by [name], [name]   Subjective:   PATIENT ID: Ryan [name] Netters GENDER: male DOB: 1950/08/01, MRN: [id number]  Chief Complaint  Patient presents with   Follow-up    Breathing is a little more difficult with exertion (exercise), cardiologist states states aortic valve (pig valve) is leaking.  He is going to the [facility] 3 times per week.    HPI Ryan [name] is a pleasant 73 year old male patient with a past medical history of ankylosing spondylitis on Humira, hyperlipidemia depression and COPD not on any inhalers presenting for evaluation of a pulmonary nodule.  He is enrolled in the lung cancer screening program and underwent a CT chest on 08/30/2022 that showed a 6.7 mm left upper lobe solid nodule behind the aortic arch.  Therefore he had a repeat CT chest in 3 months.  His CT chest was on 12/2022 and showed an 8.6 mm new left upper lobe nodule.  He was referred to us  for further evaluation.  His repeat CT chest on 01-16-2023 showed the LUL nodule resolved. But did show a right upper lobe new ndule measuring 12mm with a tree in bud pattern.   PFTs 01/20/2023 suggestive of asthma,   He denies any symptoms, one of the symptoms are chronic which are dyspnea on exertion and mucous production with possible chronic rhisinusitis.  He is active swims 15 laps.  He was just in [place] on vacation and was able to walk around without limitations.  He denies any loss of appetite and no significant weight changes.  Family history -bladder cancer in his mother who was a smoker  Social history-active smoker unclear amount but smoked cigarillos for the past 10 years.   OV 07.15.2025 - [name] is here to follow up regarding his CT chest. The latter shows complete resolution of his lung nodules. Unfortunately he had a fall and hit his left side of the chest prior to that CT chest hence the minimally displaced left 8th rib fracture. Minimal pain currently and no pain when breathing.  Furthermore, he stopped his Symbicort  and noted no difference afterwards, we discussed that current approach to asthma treatment is on an assessment basis. He continues to swim and exercises regularly.   ROS All systems were reviewed and are negative except for the above. Objective:   There were no vitals filed for this visit.    on RA BMI Readings from Last 3 Encounters:  05/12/23 27.14 kg/m  02/06/23 27.00 kg/m  01/20/23 26.96 kg/m   Wt Readings from Last 3 Encounters:  05/12/23 148 lb 6.4 oz (67.3 kg)  02/06/23 150 lb (68 kg)  01/20/23 149 lb 12.8 oz (67.9 kg)    Physical Exam GEN: NAD, Healthy Appearing HEENT: Supple Neck, Reactive Pupils, EOMI  CVS: Normal S1, Normal S2, RRR, No murmurs or ES appreciated  Lungs: Clear bilateral air entry.  Abdomen: Soft, non tender, non distended, + BS  Extremities: Warm and well perfused, No edema  Skin: No suspicious lesions appreciated  Psych: Normal Affect  Labs and imaging were reviewed.  Ancillary Information   CBC    Component Value Date/Time   WBC 8.0 11/17/2017 1032   WBC 9.4 12/21/2015 0038   RBC 4.65 11/17/2017 1032   RBC 4.45 12/21/2015 0038   HGB 15.5 11/17/2017 1032   HCT 46.0 11/17/2017 1032   PLT 199 11/17/2017 1032   MCV 99 (H) 11/17/2017 1032   MCH 33.3 (H) 11/17/2017 1032   MCH 33.3 12/21/2015  0038   MCHC 33.7 11/17/2017 1032   MCHC 33.6 12/21/2015 0038   RDW 13.9 11/17/2017 1032   EOSABS 0.2 02/06/2023 1237   Labs and imaging were reviewed.     Latest Ref Rng & Units 01/20/2023    2:06 PM 11/22/2015    3:57 PM  PFT Results  FVC-Pre L 2.20  2.57   FVC-Predicted Pre % 69  71   FVC-Post L 2.21  2.44   FVC-Predicted Post % 70  67   Pre FEV1/FVC % % 75  75   Post FEV1/FCV % % 80  79   FEV1-Pre L 1.65  1.93   FEV1-Predicted Pre % 72  71   FEV1-Post L 1.77  1.93   DLCO uncorrected ml/min/mmHg 16.74  16.81   DLCO UNC% % 83  69   DLCO corrected ml/min/mmHg 16.74  17.22   DLCO COR %Predicted % 83   70   DLVA Predicted % 106  98   TLC L 5.04  4.46   TLC % Predicted % 91  76   RV % Predicted % 131  74      Assessment & Plan:  Mr. Brawley is a pleasant 73 year old male patient with a past medical history of ankylosing spondylitis on Humira, hyperlipidemia depression and COPD not on any inhalers presenting for evaluation of a pulmonary nodule.  #Intermittent asthma  PFTs with air trapping, ventilatory defect and normal DLCO. Suggestive of asthma.  Allergen panel with reactivity to multiple antigens.  Total IgE 316  EOS 200   []  c/w Albuterol as needed  []  Discussed monitoring symptoms closely and any change, will need to go back on Maintenance treatment.   #Chronic rhinosinusitis with post nasal drip  []  Fluticasone  nasal spray 1 puff each nostril as needed.    #RUL nodule with tree in bud pattern  Likely an infectious process such as NTM. Possible mucous plugging. Less likely malignant given development in less than 1 month. Repeat CT chest in 09/2023 w/ complete resolution.   He will be having a CTA chest in 6 monhts to evaluate his Aortic valve and at this point pausing his lung cancer screening LDCT makes sense. Instructed to reach out    RTC 12 months.   I spent 20 minutes caring for this patient today, including preparing to see the patient, obtaining a medical history , reviewing a separately obtained history, performing a medically appropriate examination and/or evaluation, counseling and educating the patient/family/caregiver, ordering medications, tests, or procedures, documenting clinical information in the electronic health record, and independently interpreting results (not separately reported/billed) and communicating results to the patient/family/caregiver  Darrin Barn, MD  Pulmonary Critical Care 09/16/2023 2:24 PM

## 2023-09-22 DIAGNOSIS — M25642 Stiffness of left hand, not elsewhere classified: Secondary | ICD-10-CM | POA: Diagnosis not present

## 2023-10-07 ENCOUNTER — Ambulatory Visit (HOSPITAL_COMMUNITY)
Admission: RE | Admit: 2023-10-07 | Discharge: 2023-10-07 | Disposition: A | Discharge status: Home / Self Care | Source: Ambulatory Visit | Attending: Cardiology | Admitting: Cardiology

## 2023-10-07 DIAGNOSIS — I7121 Aneurysm of the ascending aorta, without rupture: Secondary | ICD-10-CM | POA: Insufficient documentation

## 2023-10-07 DIAGNOSIS — Z01810 Encounter for preprocedural cardiovascular examination: Secondary | ICD-10-CM | POA: Diagnosis not present

## 2023-10-07 DIAGNOSIS — T82857D Stenosis of cardiac prosthetic devices, implants and grafts, subsequent encounter: Secondary | ICD-10-CM | POA: Diagnosis not present

## 2023-10-07 LAB — ECHOCARDIOGRAM COMPLETE
AR max vel: 1.94 cm2
AV Area VTI: 2.04 cm2
AV Area mean vel: 1.84 cm2
AV Mean grad: 13.7 mmHg
AV Peak grad: 24.6 mmHg
Ao pk vel: 2.48 m/s
Area-P 1/2: 3.08 cm2
P 1/2 time: 546 ms
S' Lateral: 2.1 cm

## 2023-10-08 LAB — BASIC METABOLIC PANEL WITH GFR
BUN/Creatinine Ratio: 18 (ref 10–24)
BUN: 26 mg/dL (ref 8–27)
CO2: 23 mmol/L (ref 20–29)
Calcium: 9.4 mg/dL (ref 8.6–10.2)
Chloride: 103 mmol/L (ref 96–106)
Creatinine, Ser: 1.41 mg/dL — AB (ref 0.76–1.27)
Glucose: 80 mg/dL (ref 70–99)
Potassium: 4.9 mmol/L (ref 3.5–5.2)
Sodium: 143 mmol/L (ref 134–144)
eGFR: 53 mL/min/1.73 — AB (ref >59)

## 2023-10-09 ENCOUNTER — Ambulatory Visit: Payer: Self-pay

## 2023-10-09 DIAGNOSIS — R490 Dysphonia: Secondary | ICD-10-CM | POA: Diagnosis not present

## 2023-10-13 DIAGNOSIS — N1831 Chronic kidney disease, stage 3a: Secondary | ICD-10-CM | POA: Diagnosis not present

## 2023-10-13 DIAGNOSIS — I10 Essential (primary) hypertension: Secondary | ICD-10-CM | POA: Diagnosis not present

## 2023-10-13 DIAGNOSIS — E785 Hyperlipidemia, unspecified: Secondary | ICD-10-CM | POA: Diagnosis not present

## 2023-10-13 DIAGNOSIS — M109 Gout, unspecified: Secondary | ICD-10-CM | POA: Diagnosis not present

## 2023-10-14 DIAGNOSIS — H26491 Other secondary cataract, right eye: Secondary | ICD-10-CM | POA: Diagnosis not present

## 2023-10-14 DIAGNOSIS — H02834 Dermatochalasis of left upper eyelid: Secondary | ICD-10-CM | POA: Diagnosis not present

## 2023-10-14 DIAGNOSIS — H26493 Other secondary cataract, bilateral: Secondary | ICD-10-CM | POA: Diagnosis not present

## 2023-10-14 DIAGNOSIS — Z961 Presence of intraocular lens: Secondary | ICD-10-CM | POA: Diagnosis not present

## 2023-10-14 DIAGNOSIS — H401131 Primary open-angle glaucoma, bilateral, mild stage: Secondary | ICD-10-CM | POA: Diagnosis not present

## 2023-10-14 NOTE — Telephone Encounter (Signed)
 Please review result note.

## 2023-10-14 NOTE — Telephone Encounter (Signed)
 Patients is returning call. Please advise

## 2023-10-16 DIAGNOSIS — I700 Atherosclerosis of aorta: Secondary | ICD-10-CM | POA: Diagnosis not present

## 2023-10-16 DIAGNOSIS — J449 Chronic obstructive pulmonary disease, unspecified: Secondary | ICD-10-CM | POA: Diagnosis not present

## 2023-10-16 DIAGNOSIS — E785 Hyperlipidemia, unspecified: Secondary | ICD-10-CM | POA: Diagnosis not present

## 2023-10-16 DIAGNOSIS — M109 Gout, unspecified: Secondary | ICD-10-CM | POA: Diagnosis not present

## 2023-10-16 DIAGNOSIS — I10 Essential (primary) hypertension: Secondary | ICD-10-CM | POA: Diagnosis not present

## 2023-10-16 DIAGNOSIS — N1831 Chronic kidney disease, stage 3a: Secondary | ICD-10-CM | POA: Diagnosis not present

## 2023-10-16 DIAGNOSIS — H9193 Unspecified hearing loss, bilateral: Secondary | ICD-10-CM | POA: Diagnosis not present

## 2023-10-16 DIAGNOSIS — Z0000 Encounter for general adult medical examination without abnormal findings: Secondary | ICD-10-CM | POA: Diagnosis not present

## 2023-10-16 DIAGNOSIS — Z952 Presence of prosthetic heart valve: Secondary | ICD-10-CM | POA: Diagnosis not present

## 2023-10-17 ENCOUNTER — Encounter: Payer: Self-pay | Admitting: Cardiovascular Disease

## 2023-10-21 DIAGNOSIS — H01004 Unspecified blepharitis left upper eyelid: Secondary | ICD-10-CM | POA: Diagnosis not present

## 2023-10-21 DIAGNOSIS — B880 Other acariasis: Secondary | ICD-10-CM | POA: Diagnosis not present

## 2023-10-21 DIAGNOSIS — H16223 Keratoconjunctivitis sicca, not specified as Sjogren's, bilateral: Secondary | ICD-10-CM | POA: Diagnosis not present

## 2023-10-21 DIAGNOSIS — H01001 Unspecified blepharitis right upper eyelid: Secondary | ICD-10-CM | POA: Diagnosis not present

## 2023-10-23 ENCOUNTER — Encounter: Payer: Self-pay | Admitting: Cardiovascular Disease

## 2023-10-23 ENCOUNTER — Ambulatory Visit: Attending: Cardiovascular Disease | Admitting: Cardiovascular Disease

## 2023-10-23 VITALS — BP 130/64 | HR 51 | Ht 62.0 in | Wt 151.2 lb

## 2023-10-23 DIAGNOSIS — I7121 Aneurysm of the ascending aorta, without rupture: Secondary | ICD-10-CM | POA: Diagnosis not present

## 2023-10-23 DIAGNOSIS — I351 Nonrheumatic aortic (valve) insufficiency: Secondary | ICD-10-CM

## 2023-10-23 DIAGNOSIS — Z01812 Encounter for preprocedural laboratory examination: Secondary | ICD-10-CM | POA: Diagnosis not present

## 2023-10-23 LAB — CBC
Hematocrit: 45.9 % (ref 37.5–51.0)
Hemoglobin: 15.3 g/dL (ref 13.0–17.7)
MCH: 34.2 pg — ABNORMAL HIGH (ref 26.6–33.0)
MCHC: 33.3 g/dL (ref 31.5–35.7)
MCV: 103 fL — ABNORMAL HIGH (ref 79–97)
Platelets: 141 x10E3/uL — ABNORMAL LOW (ref 150–450)
RBC: 4.48 x10E6/uL (ref 4.14–5.80)
RDW: 12.7 % (ref 11.6–15.4)
WBC: 8.5 x10E3/uL (ref 3.4–10.8)

## 2023-10-23 MED ORDER — METOPROLOL SUCCINATE ER 25 MG PO TB24: 25.0000 mg | ORAL_TABLET | Freq: Every day | ORAL | 3 refills | Status: DC

## 2023-10-23 MED ORDER — METOPROLOL SUCCINATE ER 25 MG PO TB24: 25.0000 mg | ORAL_TABLET | Freq: Every day | ORAL | 3 refills | Status: AC

## 2023-10-23 NOTE — Assessment & Plan Note (Signed)
 Approximately 4.5 cm ascending aortic aneurysm noted, stable over time.

## 2023-10-23 NOTE — Patient Instructions (Addendum)
 Medication Instructions:  DECREASE Metoprolol  Succinate (Toprol -XL) to 25 mg once daily   *If you need a refill on your cardiac medications before your next appointment, please call your pharmacy*  Lab Work: To be completed a minimum of 3 days prior to your TEE: CBC  If you have labs (blood work) drawn today and your tests are completely normal, you will receive your results only by: MyChart Message (if you have MyChart) OR A paper copy in the mail If you have any lab test that is abnormal or we need to change your treatment, we will call you to review the results.  Testing/Procedures: Your physician has requested that you have a TEE. During a TEE, sound waves are used to create images of your heart. It provides your doctor with information about the size and shape of your heart and how well your heart's chambers and valves are working. In this test, a transducer is attached to the end of a flexible tube that's guided down your throat and into your esophagus (the tube leading from you mouth to your stomach) to get a more detailed image of your heart. You are not awake for the procedure. Please see the instruction sheet given to you today. For further information please visit [email or web address]  Your physician has requested that you have an exercise tolerance test. For further information please visit [email or web address] Please also follow instruction sheet, as given.     Follow-Up: At [facility], you and your health needs are our priority.  As part of our continuing mission to provide you with exceptional heart care, our providers are all part of one team.  This team includes your primary Cardiologist (physician) and Advanced Practice Providers or APPs (Physician Assistants and Nurse Practitioners) who all work together to provide you with the care you need, when you need it.  Your next appointment:   6 month(s)  Provider:   Ozell Fell, MD    We recommend signing up for  the patient portal called MyChart.  Sign up information is provided on this After Visit Summary.  MyChart is used to connect with patients for Virtual Visits (Telemedicine).  Patients are able to view lab/test results, encounter notes, upcoming appointments, etc.  Non-urgent messages can be sent to your provider as well.   To learn more about what you can do with MyChart, go to [email or web address].   Other Instructions Exercise Stress Test  Please arrive 15 minutes prior to your appointment time to allow for registration and insurance purposes.  The test will take approximately 45 minutes to complete.  How to prepare for your Exercise Stress Test: - Do bring a list of your current medications with you. If you do not take any of the medications listed below, you may take your medications as normal the day of the test. - Do NOT take Metoprolol  Succinate (Toprol -XL) 25 mg. Bring this medication to your appointment as you may be required to take it once the test is complete. - DO wear comfortable clothes (no dresses or overalls) and walking shoes, tennis shoes preferred (no heels or open toed shoes allowed). - Please report to [facility] [address] [place], [place] [address] for your test [phone]   If these instructions are not followed as listed above, your test will be rescheduled at a later date.  If you can not keep you appointment, please provide 24 hours notification to the Stress Lab to avoid a possible $50 charge to your patient  account.

## 2023-10-23 NOTE — Assessment & Plan Note (Signed)
 The patient has at least moderate bioprosthetic aortic valve insufficiency.  He may have a component of paravalvular insufficiency but it appears that his dominant leak is intra valvular.  He describes NYHA functional class I-II symptoms of exertional dyspnea but continues to have pretty good exercise capacity.  With his worsening AI on echo imaging, further studies are indicated.  I have recommended an exercise treadmill study to objectively evaluate his exercise capacity, hemodynamic response to exercise, and assess symptoms.  I have also recommended a transesophageal echo study to better evaluate the etiology of his aortic insufficiency and quantitate his aortic insufficiency.  If the studies are reassuring, we may continue to follow him with serial exams and imaging studies.  However, if he is found to have severe aortic insufficiency or significant reduction in exercise capacity, we will move forward with evaluation for aortic valve intervention.

## 2023-10-23 NOTE — Progress Notes (Signed)
 Cardiology Office Note:    Date:  10/23/2023   ID:  Ryan [name], DOB 03/04/1951, MRN [id number]  PCP:  [name], [name]   [facility] Providers Cardiologist:  Ozell Fell, MD     Referring MD: [name], [name]   Chief Complaint  Patient presents with   Shortness of Breath    History of Present Illness:    Ryan [name] is a 73 y.o. male with a hx of aortic valve disease, presenting for follow-up evaluation. The patient underwent bioprosthetic aortic valve replacement in 2011. 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 Recent echo showed normal LV function but worsening aortic regurgitation now in the moderate-severe range with recommendation for TEE and/or cardiac MRI to better quantitate if clinically indicated.   The patient is here with his wife today.  He remains physically active and has noticed some progression of mild exercise intolerance.  He is actually still able to swim 15 laps at the pool on a regular basis, but he has to stop and rest a little more frequently.  He also had shortness of breath recently when carrying buckets of water.  He has no symptoms with any of his normal activities.  He denies chest pain, chest pressure, edema, orthopnea, PND, or heart palpitations.   Current Medications: Current Meds  Medication Sig   albuterol (VENTOLIN HFA) 108 (90 Base) MCG/ACT inhaler Inhale 2 puffs into the lungs every 4 (four) hours as needed for wheezing or shortness of breath.   allopurinol (ZYLOPRIM) 300 MG tablet Take 150 mg by mouth daily.   amoxicillin (AMOXIL) 500 MG capsule Take 2,000 mg by mouth See admin instructions. 1 hour prior to dental appointment   Ascorbic Acid (VITAMIN C) 1000 MG tablet Take 1,000 mg by mouth daily.   Cholecalciferol (VITAMIN D-3) 25 MCG (1000 UT) CAPS daily.   Coenzyme Q10 (COQ10) 200 MG CAPS Take 200 mg by mouth daily.    colchicine 0.6 MG tablet Take 0.6 mg by mouth as needed.   Cyanocobalamin (VITAMIN B12) 1000 MCG TBCR 1 tablet Orally Once a day for 30 day(s)   HUMIRA PEN 40 MG/0.4ML PNKT Inject 40 mg as directed every 30 (thirty) days. Per patient taking twice a month   Multiple Vitamin (MULTIVITAMIN) tablet Take 1 tablet by mouth daily.   OMEGA-3 KRILL OIL PO Take 350 mg by mouth daily.   predniSONE  (DELTASONE ) 10 MG tablet Take 10 mg by mouth as needed.   rosuvastatin  (CRESTOR ) 20 MG tablet Take 1 tablet (20 mg total) by mouth daily.   sildenafil (REVATIO) 20 MG tablet Take 20 mg by mouth as needed.   Spacer/Aero-Holding [name] Use as directed   tadalafil (CIALIS) 5 MG tablet Take 1 tablet by mouth daily.   Timolol Maleate PF 0.5 % SOLN    [DISCONTINUED] metoprolol  succinate (TOPROL -XL) 50 MG 24 hr tablet TAKE 1 TABLET BY MOUTH DAILY WITH OR IMMEDIATELY FOLLOWING A MEAL     Allergies:   Azithromycin   ROS:   Please see the history of present illness.    All other systems reviewed and are negative.  EKGs/Labs/Other Studies Reviewed:    The following studies were reviewed today: Cardiac Studies & Procedures   ______________________________________________________________________________________________     ECHOCARDIOGRAM  ECHOCARDIOGRAM COMPLETE 10/07/2023  Narrative ECHOCARDIOGRAM REPORT    Patient Name:   Ryan [name]  Date of Exam: 10/07/2023  Medical Rec #:  [id number]     Height:       62.0 in Accession #:    [id number]    Weight:       148.8 lb Date of Birth:  12/15/1950    BSA:          1.686 m Patient Age:    72 years      BP:           130/70 mmHg Patient Gender: M             HR:           53 bpm. Exam Location:  [address]  Procedure: 2D Echo, 3D Echo, Cardiac Doppler and Color Doppler (Both Spectral and Color Flow Doppler were utilized during procedure).  Indications:    I71.21 Ascending Aortic Aneurysm  History:        Patient has prior history of  Echocardiogram examinations, most recent 05/06/2023. Prior Cardiac Surgery, COPD, Aortic Valve Disease, Arrythmias:Atrial Fibrillation, Signs/Symptoms:Edema and Shortness of Breath; Risk Factors:Hypertension, Dyslipidemia, Former Smoker and Family History of Coronary Artery Disease. Aortic Aneurysm with [facility]) with Aortic Stenosis and Aortic Valve Replacement (Bioprosthetic). Aortic Valve: unknown bioprosthetic valve is present in the aortic position. Procedure Date: 09/21/2009.  Sonographer:    Heather Hawks RDCS Referring Phys: ANTHONY S HAYES  IMPRESSIONS   1. Left ventricular ejection fraction, by estimation, is 60 to 65%. The left ventricle has normal function. The left ventricle has no regional wall motion abnormalities. There is mild left ventricular hypertrophy. Left ventricular diastolic parameters were grossly normal. 2. Right ventricular systolic function is normal. The right ventricular size is normal. There is normal pulmonary artery systolic pressure. The estimated right ventricular systolic pressure is 30.2 mmHg. 3. The mitral valve is grossly normal. Trivial mitral valve regurgitation. No evidence of mitral stenosis. 4. Tricuspid valve regurgitation is mild to moderate. 5. The aortic valve has been repaired/replaced. Aortic valve regurgitation is moderate to severe and is central and broad based. There is a unknown size bioprosthetic valve present in the aortic position. Procedure Date: 09/21/2009. Aortic valve area, by VTI measures 2.04 cm. Aortic valve mean gradient measures 13.8 mmHg. Aortic valve Vmax measures 2.48 m/s. Aortic valve acceleration time measures 88 msec. DVI 0.42. iEOA is 1.17 cm2/m2, AT/ET 0.25. Findings overall suggest high flow state as cause of elevated AV gradients. Aortic valve regurgitation appears central, and may require further evaulation, visually appears slightly increased compared to prior echo. Consider TEE or  cardiac MRI to quantitate AI if clinically indicated. 6. Aortic dilatation noted. Aneurysm of the ascending aorta, measuring 45 mm. There is moderate dilatation of the ascending aorta. 7. The inferior vena cava is normal in size with greater than 50% respiratory variability, suggesting right atrial pressure of 3 mmHg.  FINDINGS Left Ventricle: Left ventricular ejection fraction, by estimation, is 60 to 65%. The left ventricle has normal function. The left ventricle has no regional wall motion abnormalities. The left ventricular internal cavity size was normal in size. There is mild left ventricular hypertrophy. Left ventricular diastolic parameters were normal.  Right Ventricle: The right ventricular size is normal. No increase in right ventricular wall thickness. Right ventricular systolic function is normal. There is normal pulmonary artery systolic pressure. The tricuspid regurgitant velocity is 2.61 m/s, and with an assumed right atrial pressure of 3 mmHg, the estimated right ventricular systolic pressure is 30.2 mmHg.  Left Atrium: Left atrial size was normal in size.  Right Atrium: Right atrial size was normal in size.  Pericardium: There is no evidence of pericardial effusion.  Mitral Valve: The mitral valve is grossly normal. Trivial mitral valve regurgitation. No evidence of mitral valve stenosis.  Tricuspid Valve: The tricuspid valve is normal in structure. Tricuspid valve regurgitation is mild to moderate. No evidence of tricuspid stenosis.  Aortic Valve: The aortic valve has been repaired/replaced. Aortic valve regurgitation is moderate to severe. Aortic regurgitation PHT measures 546 msec. Aortic valve mean gradient measures 13.7 mmHg. Aortic valve peak gradient measures 24.6 mmHg. Aortic valve area, by VTI measures 2.04 cm. There is a unknown bioprosthetic valve present in the aortic position. Procedure Date: 09/21/2009.  Pulmonic Valve: The pulmonic valve was normal in  structure. Pulmonic valve regurgitation is mild. No evidence of pulmonic stenosis.  Aorta: Aortic dilatation noted. There is moderate dilatation of the ascending aorta. There is an aneurysm involving the ascending aorta measuring 45 mm.  Venous: The inferior vena cava is normal in size with greater than 50% respiratory variability, suggesting right atrial pressure of 3 mmHg.  IAS/Shunts: No atrial level shunt detected by color flow Doppler.  Additional Comments: 3D was performed not requiring image post processing on an independent workstation and was normal.   LEFT VENTRICLE PLAX 2D LVIDd:         5.10 cm   Diastology LVIDs:         2.10 cm   LV e' medial:    7.62 cm/s LV PW:         1.20 cm   LV E/e' medial:  10.2 LV IVS:        1.00 cm   LV e' lateral:   5.87 cm/s LVOT diam:     2.50 cm   LV E/e' lateral: 13.3 LV SV:         123 LV SV Index:   73 LVOT Area:     4.91 cm  3D Volume EF: 3D EF:        73 % LV EDV:       152 ml LV ESV:       40 ml LV SV:        112 ml  RIGHT VENTRICLE RV Basal diam:  3.90 cm RV S prime:     13.30 cm/s TAPSE (M-mode): 2.7 cm RVSP:           30.2 mmHg  LEFT ATRIUM           Index        RIGHT ATRIUM           Index LA diam:      3.20 cm 1.90 cm/m   RA Pressure: 3.00 mmHg LA Vol (A2C): 55.7 ml 33.04 ml/m  RA Area:     15.30 cm LA Vol (A4C): 35.2 ml 20.88 ml/m  RA Volume:   42.30 ml  25.09 ml/m AORTIC VALVE AV Area (Vmax):    1.94 cm AV Area (Vmean):   1.84 cm AV Area (VTI):     2.04 cm AV Vmax:           248.14 cm/s AV Vmean:          172.147 cm/s AV VTI:            0.605 m AV Peak Grad:      24.6 mmHg AV Mean Grad:      13.7 mmHg LVOT Vmax:         98.10 cm/s LVOT Vmean:  64.650 cm/s LVOT VTI:          0.252 m LVOT/AV VTI ratio: 0.42 AI PHT:            546 msec  AORTA Ao Root diam: 3.40 cm Ao Asc diam:  4.50 cm  MITRAL VALVE               TRICUSPID VALVE MV Area (PHT): cm         TR Peak grad:   27.2 mmHg MV  Decel Time: 246 msec    TR Vmax:        261.00 cm/s MV E velocity: 78.10 cm/s  Estimated RAP:  3.00 mmHg MV A velocity: 61.30 cm/s  RVSP:           30.2 mmHg MV E/A ratio:  1.27 SHUNTS Systemic VTI:  0.25 m Systemic Diam: 2.50 cm  Soyla Merck MD Electronically signed by Soyla Merck MD Signature Date/Time: 10/07/2023/3:25:56 PM    Final          ______________________________________________________________________________________________      EKG:   EKG Interpretation Date/Time:  Thursday October 23 2023 08:15:24 EDT Ventricular Rate:  51 PR Interval:  212 QRS Duration:  92 QT Interval:  446 QTC Calculation: 411 R Axis:   62  Text Interpretation: Sinus bradycardia with 1st degree A-V block Nonspecific ST abnormality When compared with ECG of 21-Dec-2015 00:32, T wave inversion now evident in Anterior leads Confirmed by Wonda Sharper [phone]) on 10/23/2023 8:50:17 AM    Recent Labs: 10/07/2023: BUN 26; Creatinine, Ser 1.41; Potassium 4.9; Sodium 143  Recent Lipid Panel    Component Value Date/Time   CHOL 138 11/28/2017 0917   TRIG 60 11/28/2017 0917   HDL 52 11/28/2017 0917   CHOLHDL 2.7 11/28/2017 0917   LDLCALC 74 11/28/2017 0917     Risk Assessment/Calculations:                Physical Exam:    VS:  BP 130/64 (BP Location: Left Arm, Patient Position: Sitting)   Pulse (!) 51   Ht 5' 2 (1.575 m)   Wt 151 lb 3.2 oz (68.6 kg)   SpO2 98%   BMI 27.65 kg/m     Wt Readings from Last 3 Encounters:  10/23/23 151 lb 3.2 oz (68.6 kg)  09/16/23 148 lb 12.8 oz (67.5 kg)  05/12/23 148 lb 6.4 oz (67.3 kg)     GEN:  Well nourished, well developed in no acute distress HEENT: Normal NECK: No JVD; No carotid bruits LYMPHATICS: No lymphadenopathy CARDIAC: RRR, 2/6 systolic murmur at the right upper sternal border and 2/6 diastolic decrescendo murmur at the right upper sternal border RESPIRATORY:  Clear to auscultation without rales, wheezing or rhonchi   ABDOMEN: Soft, non-tender, non-distended MUSCULOSKELETAL:  No edema; No deformity  SKIN: Warm and dry NEUROLOGIC:  Alert and oriented x 3 PSYCHIATRIC:  Normal affect   Assessment & Plan 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  He may have a component of paravalvular insufficiency but it appears that his dominant leak is intra valvular.  He describes NYHA functional class I-II symptoms of exertional dyspnea but continues to have pretty good exercise capacity.  With his worsening AI on echo imaging, further studies are indicated.  I have recommended an exercise treadmill study to objectively evaluate his exercise capacity, hemodynamic response to exercise, and assess symptoms.  I have also recommended a transesophageal echo study to better evaluate  the etiology of his aortic insufficiency and quantitate his aortic insufficiency.  If the studies are reassuring, we may continue to follow him with serial exams and imaging studies.  However, if he is found to have severe aortic insufficiency or significant reduction in exercise capacity, we will move forward with evaluation for aortic valve intervention. Aneurysm of ascending aorta without rupture (HCC) Approximately 4.5 cm ascending aortic aneurysm noted, stable over time. Pre-procedure lab exam For TEE prep     Informed Consent   Shared Decision Making/Informed Consent   The risks [esophageal damage, perforation (1:10,000 risk), bleeding, pharyngeal hematoma as well as other potential complications associated with conscious sedation including aspiration, arrhythmia, respiratory failure and death], benefits (treatment guidance and diagnostic support) and alternatives of a transesophageal echocardiogram were discussed in detail with Ryan [name] and he is willing to proceed.        Medication Adjustments/Labs and Tests Ordered: Current medicines are reviewed at length with the  patient today.  Concerns regarding medicines are outlined above.  Orders Placed This Encounter  Procedures   CBC   EXERCISE TOLERANCE TEST (ETT)   EKG 12-Lead   Meds ordered this encounter  Medications   DISCONTD: metoprolol  succinate (TOPROL -XL) 25 MG 24 hr tablet    Sig: Take 1 tablet (25 mg total) by mouth daily. TAKE 1 TABLET BY MOUTH  DAILY WITH OR IMMEDIATELY  FOLLOWING A MEAL    Dispense:  90 tablet    Refill:  3    Decreasing to 25 mg   metoprolol  succinate (TOPROL -XL) 25 MG 24 hr tablet    Sig: Take 1 tablet (25 mg total) by mouth daily. TAKE 1 TABLET BY MOUTH  DAILY WITH OR IMMEDIATELY  FOLLOWING A MEAL    Dispense:  90 tablet    Refill:  3    Decreasing to 25 mg    Patient Instructions  Medication Instructions:  DECREASE Metoprolol  Succinate (Toprol -XL) to 25 mg once daily   *If you need a refill on your cardiac medications before your next appointment, please call your pharmacy*  Lab Work: To be completed a minimum of 3 days prior to your TEE: CBC  If you have labs (blood work) drawn today and your tests are completely normal, you will receive your results only by: MyChart Message (if you have MyChart) OR A paper copy in the mail If you have any lab test that is abnormal or we need to change your treatment, we will call you to review the results.  Testing/Procedures: Your physician has requested that you have a TEE. During a TEE, sound waves are used to create images of your heart. It provides your doctor with information about the size and shape of your heart and how well your heart's chambers and valves are working. In this test, a transducer is attached to the end of a flexible tube that's guided down your throat and into your esophagus (the tube leading from you mouth to your stomach) to get a more detailed image of your heart. You are not awake for the procedure. Please see the instruction sheet given to you today. For further information please visit  [email or web address]  Your physician has requested that you have an exercise tolerance test. For further information please visit [email or web address] Please also follow instruction sheet, as given.     Follow-Up: At [facility], you and your health needs are our priority.  As part of our continuing mission to provide you with exceptional heart care, our providers  are all part of one team.  This team includes your primary Cardiologist (physician) and Advanced Practice Providers or APPs (Physician Assistants and Nurse Practitioners) who all work together to provide you with the care you need, when you need it.  Your next appointment:   6 month(s)  Provider:   Ozell Fell, MD    We recommend signing up for the patient portal called MyChart.  Sign up information is provided on this After Visit Summary.  MyChart is used to connect with patients for Virtual Visits (Telemedicine).  Patients are able to view lab/test results, encounter notes, upcoming appointments, etc.  Non-urgent messages can be sent to your provider as well.   To learn more about what you can do with MyChart, go to [email or web address].   Other Instructions Exercise Stress Test  Please arrive 15 minutes prior to your appointment time to allow for registration and insurance purposes.  The test will take approximately 45 minutes to complete.  How to prepare for your Exercise Stress Test: - Do bring a list of your current medications with you. If you do not take any of the medications listed below, you may take your medications as normal the day of the test. - Do NOT take Metoprolol  Succinate (Toprol -XL) 25 mg. Bring this medication to your appointment as you may be required to take it once the test is complete. - DO wear comfortable clothes (no dresses or overalls) and walking shoes, tennis shoes preferred (no heels or open toed shoes allowed). - Please report to [facility] [address] [place], [place] [address] for your test [phone]   If these instructions are not followed as listed above, your test will be rescheduled at a later date.  If you can not keep you appointment, please provide 24 hours notification to the Stress Lab to avoid a possible $50 charge to your patient account.       Signed, Ozell Fell, MD  10/23/2023 11:13 AM    [facility]

## 2023-10-23 NOTE — H&P (View-Only) (Signed)
 Cardiology Office Note:    Date:  10/23/2023   ID:  Ryan [name], DOB 03/04/1951, MRN [id number]  PCP:  [name], [name]   [facility] Providers Cardiologist:  Ozell Fell, MD     Referring MD: [name], [name]   Chief Complaint  Patient presents with   Shortness of Breath    History of Present Illness:    Ryan [name] is a 73 y.o. male with a hx of aortic valve disease, presenting for follow-up evaluation. The patient underwent bioprosthetic aortic valve replacement in 2011. 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 Recent echo showed normal LV function but worsening aortic regurgitation now in the moderate-severe range with recommendation for TEE and/or cardiac MRI to better quantitate if clinically indicated.   The patient is here with his wife today.  He remains physically active and has noticed some progression of mild exercise intolerance.  He is actually still able to swim 15 laps at the pool on a regular basis, but he has to stop and rest a little more frequently.  He also had shortness of breath recently when carrying buckets of water.  He has no symptoms with any of his normal activities.  He denies chest pain, chest pressure, edema, orthopnea, PND, or heart palpitations.   Current Medications: Current Meds  Medication Sig   albuterol (VENTOLIN HFA) 108 (90 Base) MCG/ACT inhaler Inhale 2 puffs into the lungs every 4 (four) hours as needed for wheezing or shortness of breath.   allopurinol (ZYLOPRIM) 300 MG tablet Take 150 mg by mouth daily.   amoxicillin (AMOXIL) 500 MG capsule Take 2,000 mg by mouth See admin instructions. 1 hour prior to dental appointment   Ascorbic Acid (VITAMIN C) 1000 MG tablet Take 1,000 mg by mouth daily.   Cholecalciferol (VITAMIN D-3) 25 MCG (1000 UT) CAPS daily.   Coenzyme Q10 (COQ10) 200 MG CAPS Take 200 mg by mouth daily.    colchicine 0.6 MG tablet Take 0.6 mg by mouth as needed.   Cyanocobalamin (VITAMIN B12) 1000 MCG TBCR 1 tablet Orally Once a day for 30 day(s)   HUMIRA PEN 40 MG/0.4ML PNKT Inject 40 mg as directed every 30 (thirty) days. Per patient taking twice a month   Multiple Vitamin (MULTIVITAMIN) tablet Take 1 tablet by mouth daily.   OMEGA-3 KRILL OIL PO Take 350 mg by mouth daily.   predniSONE  (DELTASONE ) 10 MG tablet Take 10 mg by mouth as needed.   rosuvastatin  (CRESTOR ) 20 MG tablet Take 1 tablet (20 mg total) by mouth daily.   sildenafil (REVATIO) 20 MG tablet Take 20 mg by mouth as needed.   Spacer/Aero-Holding [name] Use as directed   tadalafil (CIALIS) 5 MG tablet Take 1 tablet by mouth daily.   Timolol Maleate PF 0.5 % SOLN    [DISCONTINUED] metoprolol  succinate (TOPROL -XL) 50 MG 24 hr tablet TAKE 1 TABLET BY MOUTH DAILY WITH OR IMMEDIATELY FOLLOWING A MEAL     Allergies:   Azithromycin   ROS:   Please see the history of present illness.    All other systems reviewed and are negative.  EKGs/Labs/Other Studies Reviewed:    The following studies were reviewed today: Cardiac Studies & Procedures   ______________________________________________________________________________________________     ECHOCARDIOGRAM  ECHOCARDIOGRAM COMPLETE 10/07/2023  Narrative ECHOCARDIOGRAM REPORT    Patient Name:   Ryan [name]  Date of Exam: 10/07/2023  Medical Rec #:  [id number]     Height:       62.0 in Accession #:    [id number]    Weight:       148.8 lb Date of Birth:  12/15/1950    BSA:          1.686 m Patient Age:    72 years      BP:           130/70 mmHg Patient Gender: M             HR:           53 bpm. Exam Location:  [address]  Procedure: 2D Echo, 3D Echo, Cardiac Doppler and Color Doppler (Both Spectral and Color Flow Doppler were utilized during procedure).  Indications:    I71.21 Ascending Aortic Aneurysm  History:        Patient has prior history of  Echocardiogram examinations, most recent 05/06/2023. Prior Cardiac Surgery, COPD, Aortic Valve Disease, Arrythmias:Atrial Fibrillation, Signs/Symptoms:Edema and Shortness of Breath; Risk Factors:Hypertension, Dyslipidemia, Former Smoker and Family History of Coronary Artery Disease. Aortic Aneurysm with [facility]) with Aortic Stenosis and Aortic Valve Replacement (Bioprosthetic). Aortic Valve: unknown bioprosthetic valve is present in the aortic position. Procedure Date: 09/21/2009.  Sonographer:    Heather Hawks RDCS Referring Phys: ANTHONY S HAYES  IMPRESSIONS   1. Left ventricular ejection fraction, by estimation, is 60 to 65%. The left ventricle has normal function. The left ventricle has no regional wall motion abnormalities. There is mild left ventricular hypertrophy. Left ventricular diastolic parameters were grossly normal. 2. Right ventricular systolic function is normal. The right ventricular size is normal. There is normal pulmonary artery systolic pressure. The estimated right ventricular systolic pressure is 30.2 mmHg. 3. The mitral valve is grossly normal. Trivial mitral valve regurgitation. No evidence of mitral stenosis. 4. Tricuspid valve regurgitation is mild to moderate. 5. The aortic valve has been repaired/replaced. Aortic valve regurgitation is moderate to severe and is central and broad based. There is a unknown size bioprosthetic valve present in the aortic position. Procedure Date: 09/21/2009. Aortic valve area, by VTI measures 2.04 cm. Aortic valve mean gradient measures 13.8 mmHg. Aortic valve Vmax measures 2.48 m/s. Aortic valve acceleration time measures 88 msec. DVI 0.42. iEOA is 1.17 cm2/m2, AT/ET 0.25. Findings overall suggest high flow state as cause of elevated AV gradients. Aortic valve regurgitation appears central, and may require further evaulation, visually appears slightly increased compared to prior echo. Consider TEE or  cardiac MRI to quantitate AI if clinically indicated. 6. Aortic dilatation noted. Aneurysm of the ascending aorta, measuring 45 mm. There is moderate dilatation of the ascending aorta. 7. The inferior vena cava is normal in size with greater than 50% respiratory variability, suggesting right atrial pressure of 3 mmHg.  FINDINGS Left Ventricle: Left ventricular ejection fraction, by estimation, is 60 to 65%. The left ventricle has normal function. The left ventricle has no regional wall motion abnormalities. The left ventricular internal cavity size was normal in size. There is mild left ventricular hypertrophy. Left ventricular diastolic parameters were normal.  Right Ventricle: The right ventricular size is normal. No increase in right ventricular wall thickness. Right ventricular systolic function is normal. There is normal pulmonary artery systolic pressure. The tricuspid regurgitant velocity is 2.61 m/s, and with an assumed right atrial pressure of 3 mmHg, the estimated right ventricular systolic pressure is 30.2 mmHg.  Left Atrium: Left atrial size was normal in size.  Right Atrium: Right atrial size was normal in size.  Pericardium: There is no evidence of pericardial effusion.  Mitral Valve: The mitral valve is grossly normal. Trivial mitral valve regurgitation. No evidence of mitral valve stenosis.  Tricuspid Valve: The tricuspid valve is normal in structure. Tricuspid valve regurgitation is mild to moderate. No evidence of tricuspid stenosis.  Aortic Valve: The aortic valve has been repaired/replaced. Aortic valve regurgitation is moderate to severe. Aortic regurgitation PHT measures 546 msec. Aortic valve mean gradient measures 13.7 mmHg. Aortic valve peak gradient measures 24.6 mmHg. Aortic valve area, by VTI measures 2.04 cm. There is a unknown bioprosthetic valve present in the aortic position. Procedure Date: 09/21/2009.  Pulmonic Valve: The pulmonic valve was normal in  structure. Pulmonic valve regurgitation is mild. No evidence of pulmonic stenosis.  Aorta: Aortic dilatation noted. There is moderate dilatation of the ascending aorta. There is an aneurysm involving the ascending aorta measuring 45 mm.  Venous: The inferior vena cava is normal in size with greater than 50% respiratory variability, suggesting right atrial pressure of 3 mmHg.  IAS/Shunts: No atrial level shunt detected by color flow Doppler.  Additional Comments: 3D was performed not requiring image post processing on an independent workstation and was normal.   LEFT VENTRICLE PLAX 2D LVIDd:         5.10 cm   Diastology LVIDs:         2.10 cm   LV e' medial:    7.62 cm/s LV PW:         1.20 cm   LV E/e' medial:  10.2 LV IVS:        1.00 cm   LV e' lateral:   5.87 cm/s LVOT diam:     2.50 cm   LV E/e' lateral: 13.3 LV SV:         123 LV SV Index:   73 LVOT Area:     4.91 cm  3D Volume EF: 3D EF:        73 % LV EDV:       152 ml LV ESV:       40 ml LV SV:        112 ml  RIGHT VENTRICLE RV Basal diam:  3.90 cm RV S prime:     13.30 cm/s TAPSE (M-mode): 2.7 cm RVSP:           30.2 mmHg  LEFT ATRIUM           Index        RIGHT ATRIUM           Index LA diam:      3.20 cm 1.90 cm/m   RA Pressure: 3.00 mmHg LA Vol (A2C): 55.7 ml 33.04 ml/m  RA Area:     15.30 cm LA Vol (A4C): 35.2 ml 20.88 ml/m  RA Volume:   42.30 ml  25.09 ml/m AORTIC VALVE AV Area (Vmax):    1.94 cm AV Area (Vmean):   1.84 cm AV Area (VTI):     2.04 cm AV Vmax:           248.14 cm/s AV Vmean:          172.147 cm/s AV VTI:            0.605 m AV Peak Grad:      24.6 mmHg AV Mean Grad:      13.7 mmHg LVOT Vmax:         98.10 cm/s LVOT Vmean:  64.650 cm/s LVOT VTI:          0.252 m LVOT/AV VTI ratio: 0.42 AI PHT:            546 msec  AORTA Ao Root diam: 3.40 cm Ao Asc diam:  4.50 cm  MITRAL VALVE               TRICUSPID VALVE MV Area (PHT): cm         TR Peak grad:   27.2 mmHg MV  Decel Time: 246 msec    TR Vmax:        261.00 cm/s MV E velocity: 78.10 cm/s  Estimated RAP:  3.00 mmHg MV A velocity: 61.30 cm/s  RVSP:           30.2 mmHg MV E/A ratio:  1.27 SHUNTS Systemic VTI:  0.25 m Systemic Diam: 2.50 cm  Soyla Merck MD Electronically signed by Soyla Merck MD Signature Date/Time: 10/07/2023/3:25:56 PM    Final          ______________________________________________________________________________________________      EKG:   EKG Interpretation Date/Time:  Thursday October 23 2023 08:15:24 EDT Ventricular Rate:  51 PR Interval:  212 QRS Duration:  92 QT Interval:  446 QTC Calculation: 411 R Axis:   62  Text Interpretation: Sinus bradycardia with 1st degree A-V block Nonspecific ST abnormality When compared with ECG of 21-Dec-2015 00:32, T wave inversion now evident in Anterior leads Confirmed by Wonda Sharper [phone]) on 10/23/2023 8:50:17 AM    Recent Labs: 10/07/2023: BUN 26; Creatinine, Ser 1.41; Potassium 4.9; Sodium 143  Recent Lipid Panel    Component Value Date/Time   CHOL 138 11/28/2017 0917   TRIG 60 11/28/2017 0917   HDL 52 11/28/2017 0917   CHOLHDL 2.7 11/28/2017 0917   LDLCALC 74 11/28/2017 0917     Risk Assessment/Calculations:                Physical Exam:    VS:  BP 130/64 (BP Location: Left Arm, Patient Position: Sitting)   Pulse (!) 51   Ht 5' 2 (1.575 m)   Wt 151 lb 3.2 oz (68.6 kg)   SpO2 98%   BMI 27.65 kg/m     Wt Readings from Last 3 Encounters:  10/23/23 151 lb 3.2 oz (68.6 kg)  09/16/23 148 lb 12.8 oz (67.5 kg)  05/12/23 148 lb 6.4 oz (67.3 kg)     GEN:  Well nourished, well developed in no acute distress HEENT: Normal NECK: No JVD; No carotid bruits LYMPHATICS: No lymphadenopathy CARDIAC: RRR, 2/6 systolic murmur at the right upper sternal border and 2/6 diastolic decrescendo murmur at the right upper sternal border RESPIRATORY:  Clear to auscultation without rales, wheezing or rhonchi   ABDOMEN: Soft, non-tender, non-distended MUSCULOSKELETAL:  No edema; No deformity  SKIN: Warm and dry NEUROLOGIC:  Alert and oriented x 3 PSYCHIATRIC:  Normal affect   Assessment & Plan 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  He may have a component of paravalvular insufficiency but it appears that his dominant leak is intra valvular.  He describes NYHA functional class I-II symptoms of exertional dyspnea but continues to have pretty good exercise capacity.  With his worsening AI on echo imaging, further studies are indicated.  I have recommended an exercise treadmill study to objectively evaluate his exercise capacity, hemodynamic response to exercise, and assess symptoms.  I have also recommended a transesophageal echo study to better evaluate  the etiology of his aortic insufficiency and quantitate his aortic insufficiency.  If the studies are reassuring, we may continue to follow him with serial exams and imaging studies.  However, if he is found to have severe aortic insufficiency or significant reduction in exercise capacity, we will move forward with evaluation for aortic valve intervention. Aneurysm of ascending aorta without rupture (HCC) Approximately 4.5 cm ascending aortic aneurysm noted, stable over time. Pre-procedure lab exam For TEE prep     Informed Consent   Shared Decision Making/Informed Consent   The risks [esophageal damage, perforation (1:10,000 risk), bleeding, pharyngeal hematoma as well as other potential complications associated with conscious sedation including aspiration, arrhythmia, respiratory failure and death], benefits (treatment guidance and diagnostic support) and alternatives of a transesophageal echocardiogram were discussed in detail with Ryan [name] and he is willing to proceed.        Medication Adjustments/Labs and Tests Ordered: Current medicines are reviewed at length with the  patient today.  Concerns regarding medicines are outlined above.  Orders Placed This Encounter  Procedures   CBC   EXERCISE TOLERANCE TEST (ETT)   EKG 12-Lead   Meds ordered this encounter  Medications   DISCONTD: metoprolol  succinate (TOPROL -XL) 25 MG 24 hr tablet    Sig: Take 1 tablet (25 mg total) by mouth daily. TAKE 1 TABLET BY MOUTH  DAILY WITH OR IMMEDIATELY  FOLLOWING A MEAL    Dispense:  90 tablet    Refill:  3    Decreasing to 25 mg   metoprolol  succinate (TOPROL -XL) 25 MG 24 hr tablet    Sig: Take 1 tablet (25 mg total) by mouth daily. TAKE 1 TABLET BY MOUTH  DAILY WITH OR IMMEDIATELY  FOLLOWING A MEAL    Dispense:  90 tablet    Refill:  3    Decreasing to 25 mg    Patient Instructions  Medication Instructions:  DECREASE Metoprolol  Succinate (Toprol -XL) to 25 mg once daily   *If you need a refill on your cardiac medications before your next appointment, please call your pharmacy*  Lab Work: To be completed a minimum of 3 days prior to your TEE: CBC  If you have labs (blood work) drawn today and your tests are completely normal, you will receive your results only by: MyChart Message (if you have MyChart) OR A paper copy in the mail If you have any lab test that is abnormal or we need to change your treatment, we will call you to review the results.  Testing/Procedures: Your physician has requested that you have a TEE. During a TEE, sound waves are used to create images of your heart. It provides your doctor with information about the size and shape of your heart and how well your heart's chambers and valves are working. In this test, a transducer is attached to the end of a flexible tube that's guided down your throat and into your esophagus (the tube leading from you mouth to your stomach) to get a more detailed image of your heart. You are not awake for the procedure. Please see the instruction sheet given to you today. For further information please visit  [email or web address]  Your physician has requested that you have an exercise tolerance test. For further information please visit [email or web address] Please also follow instruction sheet, as given.     Follow-Up: At [facility], you and your health needs are our priority.  As part of our continuing mission to provide you with exceptional heart care, our providers  are all part of one team.  This team includes your primary Cardiologist (physician) and Advanced Practice Providers or APPs (Physician Assistants and Nurse Practitioners) who all work together to provide you with the care you need, when you need it.  Your next appointment:   6 month(s)  Provider:   Ozell Fell, MD    We recommend signing up for the patient portal called MyChart.  Sign up information is provided on this After Visit Summary.  MyChart is used to connect with patients for Virtual Visits (Telemedicine).  Patients are able to view lab/test results, encounter notes, upcoming appointments, etc.  Non-urgent messages can be sent to your provider as well.   To learn more about what you can do with MyChart, go to [email or web address].   Other Instructions Exercise Stress Test  Please arrive 15 minutes prior to your appointment time to allow for registration and insurance purposes.  The test will take approximately 45 minutes to complete.  How to prepare for your Exercise Stress Test: - Do bring a list of your current medications with you. If you do not take any of the medications listed below, you may take your medications as normal the day of the test. - Do NOT take Metoprolol  Succinate (Toprol -XL) 25 mg. Bring this medication to your appointment as you may be required to take it once the test is complete. - DO wear comfortable clothes (no dresses or overalls) and walking shoes, tennis shoes preferred (no heels or open toed shoes allowed). - Please report to [facility] [address] [place], [place] [address] for your test [phone]   If these instructions are not followed as listed above, your test will be rescheduled at a later date.  If you can not keep you appointment, please provide 24 hours notification to the Stress Lab to avoid a possible $50 charge to your patient account.       Signed, Ozell Fell, MD  10/23/2023 11:13 AM    [facility]

## 2023-10-23 NOTE — Progress Notes (Signed)
 Pre Surgical Assessment: 5 M Walk Test  80M=16.52ft  5 Meter Walk Test- trial 1: 5.58 seconds 5 Meter Walk Test- trial 2: 5.01 seconds 5 Meter Walk Test- trial 3: 5.15 seconds 5 Meter Walk Test Average: 5.25 seconds

## 2023-10-24 NOTE — Progress Notes (Signed)
 Called patient with pre-procedure instructions for Monday; left message in voicemail box of Ryan [name] indicating the following:  Time to arrive for procedure (0900). Remain NPO past midnight.  Must have a ride home and a responsible adult to remain with them for 24 hours post procedure. Instructed to take am meds with sip of water; asked pt to return call to confirm which meds he should and should not be taking prior to procedure.

## 2023-10-26 ENCOUNTER — Encounter: Payer: Self-pay | Admitting: Cardiovascular Disease

## 2023-10-26 NOTE — Anesthesia Preprocedure Evaluation (Addendum)
 Anesthesia Evaluation  Patient identified by MRN, date of birth, ID band Patient awake    Reviewed: Allergy & Precautions, NPO status , Patient's Chart, lab work & pertinent test results, reviewed documented beta blocker date and time   History of Anesthesia Complications Negative for: history of anesthetic complications  Airway Mallampati: III  TM Distance: >3 FB Neck ROM: Limited  Mouth opening: Limited Mouth Opening  Dental no notable dental hx. (+) Teeth Intact, Dental Advisory Given   Pulmonary COPD,  COPD inhaler, former smoker   Pulmonary exam normal breath sounds clear to auscultation       Cardiovascular hypertension, Pt. on medications and Pt. on home beta blockers (-) angina (-) Past MI Normal cardiovascular exam+ Valvular Problems/Murmurs (mod /severe s/p biprostetic valve 2011) AI  Rhythm:Regular Rate:Normal  10/07/2023 TTE 1. Left ventricular ejection fraction, by estimation, is 60 to 65%. The  left ventricle has normal function. The left ventricle has no regional  wall motion abnormalities. There is mild left ventricular hypertrophy.  Left ventricular diastolic parameters  were grossly normal.   2. Right ventricular systolic function is normal. The right ventricular  size is normal. There is normal pulmonary artery systolic pressure. The  estimated right ventricular systolic pressure is 30.2 mmHg.   3. The mitral valve is grossly normal. Trivial mitral valve  regurgitation. No evidence of mitral stenosis.   4. Tricuspid valve regurgitation is mild to moderate.   5. The aortic valve has been repaired/replaced. Aortic valve  regurgitation is moderate to severe and is central and broad based. There  is a unknown size bioprosthetic valve present in the aortic position.  Procedure Date: 09/21/2009. Aortic valve area, by   VTI measures 2.04 cm. Aortic valve mean gradient measures 13.8 mmHg.  Aortic valve Vmax measures  2.48 m/s. Aortic valve acceleration time  measures 88 msec. DVI 0.42. iEOA is 1.17 cm2/m2, AT/ET 0.25. Findings  overall suggest high flow state as cause of  elevated AV gradients. Aortic valve regurgitation appears central, and may  require further evaulation, visually appears slightly increased compared  to prior echo. Consider TEE or cardiac MRI to quantitate AI if clinically  indicated.   6. Aortic dilatation noted. Aneurysm of the ascending aorta, measuring 45  mm. There is moderate dilatation of the ascending aorta.   7. The inferior vena cava is normal in size with greater than 50%  respiratory variability, suggesting right atrial pressure of 3 mmHg.      Neuro/Psych negative neurological ROS  negative psych ROS   GI/Hepatic   Endo/Other    Renal/GU Renal diseaseLab Results      Component                Value               Date                                 K                        4.9                 10/07/2023                CO2                      23  10/07/2023                BUN                      26                  10/07/2023                CREATININE               1.41 (H)            10/07/2023                EGFR                     53 (L)              10/07/2023                     Musculoskeletal  (+) Arthritis ,  Ankylosing spondylosis   Abdominal   Peds  Hematology Lab Results      Component                Value               Date                      WBC                      8.5                 10/23/2023                HGB                      15.3                10/23/2023                HCT                      45.9                10/23/2023                MCV                      103 (H)             10/23/2023                PLT                      141 (L)             10/23/2023              Anesthesia Other Findings All: Azithromycin  Reproductive/Obstetrics                               Anesthesia Physical Anesthesia Plan  ASA: 3  Anesthesia Plan: MAC   Post-op Pain Management: Minimal or no pain anticipated   Induction:   PONV Risk Score and Plan: 1 and Propofol  infusion and Treatment may vary due to age or medical condition  Airway Management Planned:  Natural Airway and Nasal Cannula  Additional Equipment: None  Intra-op Plan:   Post-operative Plan:   Informed Consent: I have reviewed the patients History and Physical, chart, labs and discussed the procedure including the risks, benefits and alternatives for the proposed anesthesia with the patient or authorized representative who has indicated his/her understanding and acceptance.     Dental advisory given  Plan Discussed with: CRNA and Surgeon  Anesthesia Plan Comments: (TEE for TAVR workup for AS)         Anesthesia Quick Evaluation

## 2023-10-27 ENCOUNTER — Ambulatory Visit (HOSPITAL_COMMUNITY): Attending: Internal Medicine

## 2023-10-27 ENCOUNTER — Encounter (HOSPITAL_COMMUNITY)
Admission: RE | Disposition: A | Discharge status: Home / Self Care | Source: Home / Self Care | Attending: Internal Medicine

## 2023-10-27 ENCOUNTER — Ambulatory Visit (HOSPITAL_COMMUNITY): Payer: Self-pay | Admitting: Anesthesiology

## 2023-10-27 ENCOUNTER — Ambulatory Visit (HOSPITAL_COMMUNITY)
Admission: RE | Admit: 2023-10-27 | Discharge: 2023-10-27 | Disposition: A | Discharge status: Home / Self Care | Attending: Internal Medicine | Admitting: Internal Medicine

## 2023-10-27 ENCOUNTER — Other Ambulatory Visit: Payer: Self-pay

## 2023-10-27 ENCOUNTER — Encounter (HOSPITAL_COMMUNITY): Payer: Self-pay | Admitting: Internal Medicine

## 2023-10-27 DIAGNOSIS — J449 Chronic obstructive pulmonary disease, unspecified: Secondary | ICD-10-CM | POA: Insufficient documentation

## 2023-10-27 DIAGNOSIS — I351 Nonrheumatic aortic (valve) insufficiency: Secondary | ICD-10-CM

## 2023-10-27 DIAGNOSIS — I7121 Aneurysm of the ascending aorta, without rupture: Secondary | ICD-10-CM | POA: Insufficient documentation

## 2023-10-27 DIAGNOSIS — Z87891 Personal history of nicotine dependence: Secondary | ICD-10-CM | POA: Diagnosis not present

## 2023-10-27 DIAGNOSIS — I10 Essential (primary) hypertension: Secondary | ICD-10-CM

## 2023-10-27 DIAGNOSIS — I352 Nonrheumatic aortic (valve) stenosis with insufficiency: Secondary | ICD-10-CM | POA: Diagnosis not present

## 2023-10-27 DIAGNOSIS — I361 Nonrheumatic tricuspid (valve) insufficiency: Secondary | ICD-10-CM | POA: Diagnosis not present

## 2023-10-27 DIAGNOSIS — I080 Rheumatic disorders of both mitral and aortic valves: Secondary | ICD-10-CM | POA: Diagnosis not present

## 2023-10-27 DIAGNOSIS — I082 Rheumatic disorders of both aortic and tricuspid valves: Secondary | ICD-10-CM

## 2023-10-27 DIAGNOSIS — I700 Atherosclerosis of aorta: Secondary | ICD-10-CM | POA: Insufficient documentation

## 2023-10-27 DIAGNOSIS — Z953 Presence of xenogenic heart valve: Secondary | ICD-10-CM | POA: Diagnosis not present

## 2023-10-27 DIAGNOSIS — I081 Rheumatic disorders of both mitral and tricuspid valves: Secondary | ICD-10-CM | POA: Diagnosis not present

## 2023-10-27 HISTORY — PX: TRANSESOPHAGEAL ECHOCARDIOGRAM (CATH LAB): EP1270

## 2023-10-27 LAB — ECHO TEE
AR max vel: 1.32 cm2
AV Area VTI: 1.35 cm2
AV Area mean vel: 1.25 cm2
AV Mean grad: 15 mmHg
AV Peak grad: 21.9 mmHg
Ao pk vel: 2.34 m/s

## 2023-10-27 SURGERY — TRANSESOPHAGEAL ECHOCARDIOGRAM (TEE) (CATHLAB)
Anesthesia: Monitor Anesthesia Care

## 2023-10-27 MED ORDER — LIDOCAINE 2% (20 MG/ML) 5 ML SYRINGE
INTRAMUSCULAR | Status: DC | PRN
  Administered 2023-10-27: 60 mg via INTRAVENOUS

## 2023-10-27 MED ORDER — SODIUM CHLORIDE 0.9 % IV SOLN: INTRAVENOUS | Status: DC

## 2023-10-27 MED ORDER — PROPOFOL 10 MG/ML IV BOLUS
INTRAVENOUS | Status: DC | PRN
  Administered 2023-10-27: 125 ug/kg/min via INTRAVENOUS
  Administered 2023-10-27: 100 mg via INTRAVENOUS

## 2023-10-27 NOTE — Transfer of Care (Signed)
 Immediate Anesthesia Transfer of Care Note  Patient: Ryan [name]  Procedure(s) Performed: TRANSESOPHAGEAL ECHOCARDIOGRAM  Patient Location: PACU  Anesthesia Type:MAC  Level of Consciousness: oriented and patient cooperative  Airway & Oxygen Therapy: Patient Spontanous Breathing and Patient connected to nasal cannula oxygen  Post-op Assessment: Report given to RN, Post -op Vital signs reviewed and stable, Patient moving all extremities X 4, and Patient able to stick tongue midline  Post vital signs: Reviewed and stable  Last Vitals:  Vitals Value Taken Time  BP 90/53   Temp 97.5   Pulse 51 10/27/23 11:21  Resp 17 10/27/23 11:21  SpO2 95 % 10/27/23 11:21  Vitals shown include unfiled device data.  Last Pain:  Vitals:   10/27/23 0933  TempSrc:   PainSc: 0-No pain         Complications: No notable events documented.

## 2023-10-27 NOTE — Anesthesia Postprocedure Evaluation (Signed)
 Anesthesia Post Note  Patient: Ryan [name]  Procedure(s) Performed: TRANSESOPHAGEAL ECHOCARDIOGRAM     Patient location during evaluation: Cath Lab Anesthesia Type: MAC Level of consciousness: awake and alert Pain management: pain level controlled Vital Signs Assessment: post-procedure vital signs reviewed and stable Respiratory status: spontaneous breathing, nonlabored ventilation, respiratory function stable and patient connected to nasal cannula oxygen Cardiovascular status: blood pressure returned to baseline and stable Postop Assessment: no apparent nausea or vomiting Anesthetic complications: no   No notable events documented.  Last Vitals:  Vitals:   10/27/23 1145 10/27/23 1150  BP: 126/84 132/71  Pulse: (!) 51 (!) 47  Resp: 19 12  Temp:    SpO2: 98% 95%    Last Pain:  Vitals:   10/27/23 1150  TempSrc:   PainSc: 0-No pain                 Garnette DELENA Gab

## 2023-10-27 NOTE — CV Procedure (Signed)
 INDICATIONS: Prosthetic aortic valve regurgitation.  PROCEDURE:   Informed consent was obtained prior to the procedure. The risks, benefits and alternatives for the procedure were discussed and the patient comprehended these risks.  Risks include, but are not limited to, cough, sore throat, vomiting, nausea, somnolence, esophageal and stomach trauma or perforation, bleeding, low blood pressure, aspiration, pneumonia, infection, trauma to the teeth and death.    After a procedural time-out, the oropharynx was anesthetized with 20% benzocaine spray.   During this procedure the patient was administered propofol  per anesthesia.  The patient's heart rate, blood pressure, and oxygen saturation were monitored continuously during the procedure. The period of conscious sedation was 30 minutes, of which I was present face-to-face 100% of this time.  The transesophageal probe was inserted in the esophagus and stomach without difficulty and multiple views were obtained.  The patient was kept under observation until the patient left the procedure room.  The patient left the procedure room in stable condition.   Agitated microbubble saline contrast was administered.  COMPLICATIONS:    There were no immediate complications.  FINDINGS:   FORMAL ECHOCARDIOGRAM REPORT PENDING Normal LV function Well seated bioprosthetic aortic valve with calcified degenerative prosthetic leaflets. 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 Holodiastolic reversal TVI is 22 suggesting severe AI, the Vena contracta 0.5 cm, suggesting moderate-severe. No paravalvular regurgitation.  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  Trivial MR, mild TR, trivial PI No PFO   RECOMMENDATIONS:     Dc when alert.  Time Spent Directly with the Patient:  40 minutes   Anaisha Mago A Duard Spiewak 10/27/2023, 11:35 AM

## 2023-10-27 NOTE — Interval H&P Note (Signed)
 History and Physical Interval Note:  10/27/2023 9:12 AM  Ryan [name]  has presented today for surgery, with the diagnosis of TAVR WORK UP/AORTIC STENOSIS.  The various methods of treatment have been discussed with the patient and family. After consideration of risks, benefits and other options for treatment, the patient has consented to  Procedure(s): TRANSESOPHAGEAL ECHOCARDIOGRAM (N/A) as a surgical intervention.  The patient's history has been reviewed, patient examined, no change in status, stable for surgery.  I have reviewed the patient's chart and labs.  Questions were answered to the patient's satisfaction.     Izabela Ow A Leotta Weingarten

## 2023-10-27 NOTE — Anesthesia Procedure Notes (Signed)
 Procedure Name: MAC Date/Time: 10/27/2023 10:44 AM  Performed by: Viviana Almarie DASEN, CRNAPre-anesthesia Checklist: Patient identified, Suction available, Patient being monitored, Emergency Drugs available and Timeout performed Patient Re-evaluated:Patient Re-evaluated prior to induction Oxygen Delivery Method: Nasal cannula Induction Type: IV induction Airway Equipment and Method: Bite block Placement Confirmation: positive ETCO2

## 2023-10-28 DIAGNOSIS — H26492 Other secondary cataract, left eye: Secondary | ICD-10-CM | POA: Diagnosis not present

## 2023-10-30 ENCOUNTER — Telehealth (HOSPITAL_COMMUNITY): Payer: Self-pay | Admitting: *Deleted

## 2023-10-30 ENCOUNTER — Encounter (HOSPITAL_COMMUNITY): Payer: Self-pay | Admitting: *Deleted

## 2023-11-05 ENCOUNTER — Ambulatory Visit (HOSPITAL_COMMUNITY)

## 2023-11-06 DIAGNOSIS — B880 Other acariasis: Secondary | ICD-10-CM | POA: Diagnosis not present

## 2023-11-06 DIAGNOSIS — H01001 Unspecified blepharitis right upper eyelid: Secondary | ICD-10-CM | POA: Diagnosis not present

## 2023-11-06 DIAGNOSIS — H01004 Unspecified blepharitis left upper eyelid: Secondary | ICD-10-CM | POA: Diagnosis not present

## 2023-11-07 DIAGNOSIS — L578 Other skin changes due to chronic exposure to nonionizing radiation: Secondary | ICD-10-CM | POA: Diagnosis not present

## 2023-11-07 DIAGNOSIS — L84 Corns and callosities: Secondary | ICD-10-CM | POA: Diagnosis not present

## 2023-11-07 DIAGNOSIS — D22122 Melanocytic nevi of left lower eyelid, including canthus: Secondary | ICD-10-CM | POA: Diagnosis not present

## 2023-11-07 DIAGNOSIS — D485 Neoplasm of uncertain behavior of skin: Secondary | ICD-10-CM | POA: Diagnosis not present

## 2023-11-07 DIAGNOSIS — D225 Melanocytic nevi of trunk: Secondary | ICD-10-CM | POA: Diagnosis not present

## 2023-11-07 DIAGNOSIS — L821 Other seborrheic keratosis: Secondary | ICD-10-CM | POA: Diagnosis not present

## 2023-11-07 DIAGNOSIS — Z85828 Personal history of other malignant neoplasm of skin: Secondary | ICD-10-CM | POA: Diagnosis not present

## 2023-11-07 DIAGNOSIS — L111 Transient acantholytic dermatosis [Grover]: Secondary | ICD-10-CM | POA: Diagnosis not present

## 2023-11-07 DIAGNOSIS — L570 Actinic keratosis: Secondary | ICD-10-CM | POA: Diagnosis not present

## 2023-11-07 DIAGNOSIS — L814 Other melanin hyperpigmentation: Secondary | ICD-10-CM | POA: Diagnosis not present

## 2023-11-07 DIAGNOSIS — D0439 Carcinoma in situ of skin of other parts of face: Secondary | ICD-10-CM | POA: Diagnosis not present

## 2023-11-10 ENCOUNTER — Ambulatory Visit: Admitting: Pulmonary Disease

## 2023-11-12 ENCOUNTER — Ambulatory Visit: Admitting: Physician Assistant

## 2023-11-13 ENCOUNTER — Ambulatory Visit: Payer: Self-pay | Admitting: Cardiovascular Disease

## 2023-11-13 ENCOUNTER — Ambulatory Visit (HOSPITAL_COMMUNITY)
Admission: RE | Admit: 2023-11-13 | Discharge: 2023-11-13 | Disposition: A | Discharge status: Home / Self Care | Source: Ambulatory Visit | Attending: Cardiology | Admitting: Cardiology

## 2023-11-13 DIAGNOSIS — M546 Pain in thoracic spine: Secondary | ICD-10-CM | POA: Diagnosis not present

## 2023-11-13 DIAGNOSIS — M65342 Trigger finger, left ring finger: Secondary | ICD-10-CM | POA: Diagnosis not present

## 2023-11-13 DIAGNOSIS — M25551 Pain in right hip: Secondary | ICD-10-CM | POA: Diagnosis not present

## 2023-11-13 DIAGNOSIS — Z111 Encounter for screening for respiratory tuberculosis: Secondary | ICD-10-CM | POA: Diagnosis not present

## 2023-11-13 DIAGNOSIS — M1991 Primary osteoarthritis, unspecified site: Secondary | ICD-10-CM | POA: Diagnosis not present

## 2023-11-13 DIAGNOSIS — R6889 Other general symptoms and signs: Secondary | ICD-10-CM | POA: Diagnosis not present

## 2023-11-13 DIAGNOSIS — M452 Ankylosing spondylitis of cervical region: Secondary | ICD-10-CM | POA: Diagnosis not present

## 2023-11-13 DIAGNOSIS — I351 Nonrheumatic aortic (valve) insufficiency: Secondary | ICD-10-CM | POA: Insufficient documentation

## 2023-11-13 DIAGNOSIS — M1A09X0 Idiopathic chronic gout, multiple sites, without tophus (tophi): Secondary | ICD-10-CM | POA: Diagnosis not present

## 2023-11-13 DIAGNOSIS — M8580 Other specified disorders of bone density and structure, unspecified site: Secondary | ICD-10-CM | POA: Diagnosis not present

## 2023-11-13 DIAGNOSIS — M25511 Pain in right shoulder: Secondary | ICD-10-CM | POA: Diagnosis not present

## 2023-11-13 LAB — EXERCISE TOLERANCE TEST
Angina Index: 0
Duke Treadmill Score: 10
Estimated workload: 12
Exercise duration (min): 10 min
Exercise duration (sec): 11 s
MPHR: 147 {beats}/min
Peak HR: 126 {beats}/min
Percent HR: 85 %
Rest HR: 63 {beats}/min
ST Depression (mm): 0 mm

## 2023-11-24 ENCOUNTER — Encounter: Payer: Self-pay | Admitting: Cardiovascular Disease

## 2023-11-24 ENCOUNTER — Ambulatory Visit: Attending: Cardiovascular Disease | Admitting: Cardiovascular Disease

## 2023-11-24 VITALS — BP 110/70 | HR 52 | Ht 62.0 in | Wt 146.6 lb

## 2023-11-24 DIAGNOSIS — I351 Nonrheumatic aortic (valve) insufficiency: Secondary | ICD-10-CM | POA: Diagnosis not present

## 2023-11-24 NOTE — Progress Notes (Signed)
 Cardiology Office Note:    Date:  11/24/2023   ID:  Ryan [name], DOB 07-Jul-1950, MRN [id number]  PCP:  Ryan Anthony RAMAN, [name]   Ryan [name] Cardiologist:  Ryan [name], Ryan [name]     Referring Ryan [name]: Ryan Anthony RAMAN, [name]   Chief Complaint  Patient presents with   Follow-up    Aortic insufficiency    History of Present Illness:    Ryan [name] is a 73 y.o. male with a hx of bioprosthetic aortic insufficiency, presenting for follow-up evaluation.  The patient was just recently seen in August 2025 for moderately severe prosthetic aortic insufficiency.  It was recommended that he undergo an exercise treadmill tolerance study where he did very well, exercising for over 10 minutes and 12 metabolic equivalents without any significant symptoms or ST segment deviation.  He underwent a transesophageal echo demonstrating normal LVEF of 60 to 65%, normal RV function, trivial MR and severe aortic insufficiency.  Some of the parameters of the TEE were in the moderate to severe range, but other suggested severe AI.  The patient is here with his wife today.  He continues to do well.  He denies chest pain, shortness of breath, heart palpitations, orthopnea, or PND.  His original surgery was in 2011 and he was treated with a Ryan [name].  At that time his ascending aorta was 43 mm and he did not have aortic repair performed.   Current Medications: Current Meds  Medication Sig   albuterol (VENTOLIN HFA) 108 (90 Base) MCG/ACT inhaler Inhale 2 puffs into the lungs every 4 (four) hours as needed for wheezing or shortness of breath.   allopurinol (ZYLOPRIM) 300 MG tablet Take 150 mg by mouth daily.   amoxicillin (AMOXIL) 500 MG capsule Take 2,000 mg by mouth See admin instructions. 1 hour prior to dental appointment   ascorbic acid (VITAMIN C) 500 MG tablet Take 500 mg by mouth daily.   Cholecalciferol (VITAMIN D-3) 25 MCG (1000 UT) CAPS daily.   Coenzyme Q10 (COQ10) 200 MG  CAPS Take 200 mg by mouth daily.   colchicine 0.6 MG tablet Take 0.6 mg by mouth daily as needed (gout flares).   Cyanocobalamin (VITAMIN B12) 1000 MCG TBCR Take 1,000 mg by mouth daily.   fluorouracil  (EFUDEX ) 5 % cream 1 Application daily.   HUMIRA PEN 40 MG/0.4ML PNKT Inject 40 mg as directed every 14 (fourteen) days.   metoprolol  succinate (TOPROL -XL) 25 MG 24 hr tablet Take 1 tablet (25 mg total) by mouth daily. TAKE 1 TABLET BY MOUTH  DAILY WITH OR IMMEDIATELY  FOLLOWING A MEAL   Multiple Vitamin (MULTIVITAMIN) tablet Take 1 tablet by mouth daily.   OMEGA-3 KRILL OIL PO Take 350 mg by mouth daily.   predniSONE  (DELTASONE ) 10 MG tablet as needed.   rosuvastatin  (CRESTOR ) 40 MG tablet Take 40 mg by mouth daily.   sildenafil (REVATIO) 20 MG tablet Take 20 mg by mouth as needed (ED).   Spacer/Aero-Holding Ryan [name] Use as directed   tadalafil (CIALIS) 5 MG tablet Take 5 mg by mouth daily.   timolol (BETIMOL) 0.5 % ophthalmic solution Place 1 drop into both eyes 2 (two) times daily.   TURMERIC CURCUMIN PO Take 1,000 mg by mouth daily.   XDEMVY 0.25 % SOLN      Allergies:   Azithromycin   Past Medical History:  Diagnosis Date   Ankylosing spondylitis (HCC)    Aortic aneurysm of unspecified site, ruptured (HCC)  last aortic root diameter was 3.9 cm   Atrial fibrillation (HCC)    Post-op A fib   Benign hematuria    BPH (benign prostatic hyperplasia)    Chronic airway obstruction, not elsewhere classified    DOE possibly related to this problem and some restriction from ankylosing spondylitis   Essential hypertension, benign    controlled   Heart [name] replaced by other means 2011   s/p bioprosthetic aortic [name]. Mild Ryan [name] is followed clinically and by exam over the past 2-3 years.   Hypertension    IBS (irritable bowel syndrome)    Pure hypercholesterolemia    Renal insufficiency    Thoracic aortic aneurysm    treated with aortoplasty at the time of aortic  [name] replacement, [facility], 09/2009   Past Surgical History:  Procedure Laterality Date   AORTIC [name] REPLACEMENT  09/21/09   With bioprosthesis at the [facility]   TRANSESOPHAGEAL ECHOCARDIOGRAM (CATH LAB) N/A 10/27/2023   Procedure: TRANSESOPHAGEAL ECHOCARDIOGRAM;  Surgeon: Ryan Ryan LABOR, Ryan [name];  Location: [facility] INVASIVE CV LAB;  Service: Cardiovascular;  Laterality: N/A;    ROS:   Please see the history of present illness.    All other systems reviewed and are negative.  EKGs/Labs/Other Studies Reviewed:    The following studies were reviewed today: Cardiac Studies & Procedures   ______________________________________________________________________________________________   STRESS TESTS  EXERCISE TOLERANCE TEST (ETT) 11/13/2023  Interpretation Summary   No ST deviation was noted.   Prior study not available for comparison.  Negative stress test for ischemia.  Low risk study. Exercised for over 10 minutes with 12 METS (good for age and risk matched controls). Greater than 10 METS achieved, this is a positive prognostic indicator.   ECHOCARDIOGRAM  ECHOCARDIOGRAM COMPLETE 10/07/2023  Narrative ECHOCARDIOGRAM REPORT    Patient Name:   Ryan [name]  Date of Exam: 10/07/2023 Medical Rec #:  [id number]     Height:       62.0 in Accession #:    [id number]    Weight:       148.8 lb Date of Birth:  23-Nov-1950    BSA:          1.686 m Patient Age:    72 years      BP:           130/70 mmHg Patient Gender: M             HR:           53 bpm. Exam Location:  [address]  Procedure: 2D Echo, 3D Echo, Cardiac Doppler and Color Doppler (Both Spectral and Color Flow Doppler were utilized during procedure).  Indications:    I71.21 Ascending Aortic Aneurysm  History:        Patient has prior history of Echocardiogram examinations, most recent 05/06/2023. Prior Cardiac Surgery, COPD, Aortic [name] Disease, Arrythmias:Atrial Fibrillation, Signs/Symptoms:Edema and  Shortness of Breath; Risk Factors:Hypertension, Dyslipidemia, Former Smoker and Family History of Coronary Artery Disease. [facility] with Aortic Stenosis and Aortic [name] Replacement (Bioprosthetic). Aortic [name]: unknown bioprosthetic [name] is present in the aortic position. Procedure Date: 09/21/2009.  Sonographer:    Ryan [name] Ryan [name] Referring Phys: ANTHONY S HAYES  IMPRESSIONS   1. Left ventricular ejection fraction, by estimation, is 60 to 65%. The left ventricle has normal function. The left ventricle has no regional wall motion abnormalities. There is mild left ventricular hypertrophy. Left ventricular diastolic parameters were grossly normal. 2. Right  ventricular systolic function is normal. The right ventricular size is normal. There is normal pulmonary artery systolic pressure. The estimated right ventricular systolic pressure is 30.2 mmHg. 3. The mitral [name] is grossly normal. Trivial mitral [name] regurgitation. No evidence of mitral stenosis. 4. Tricuspid [name] regurgitation is mild to moderate. 5. The aortic [name] has been repaired/replaced. Aortic [name] regurgitation is moderate to severe and is central and broad based. There is a unknown size bioprosthetic [name] present in the aortic position. Procedure Date: 09/21/2009. Aortic [name] area, by VTI measures 2.04 cm. Aortic [name] mean gradient measures 13.8 mmHg. Aortic [name] Vmax measures 2.48 m/s. Aortic [name] acceleration time measures 88 msec. DVI 0.42. iEOA is 1.17 cm2/m2, AT/ET 0.25. Findings overall suggest high flow state as cause of elevated AV gradients. Aortic [name] regurgitation appears central, and may require further evaulation, visually appears slightly increased compared to prior echo. Consider TEE or cardiac MRI to quantitate AI if clinically indicated. 6. Aortic dilatation noted. Aneurysm of the ascending aorta, measuring 45 mm. There is moderate dilatation of the  ascending aorta. 7. The inferior vena cava is normal in size with greater than 50% respiratory variability, suggesting right atrial pressure of 3 mmHg.  FINDINGS Left Ventricle: Left ventricular ejection fraction, by estimation, is 60 to 65%. The left ventricle has normal function. The left ventricle has no regional wall motion abnormalities. The left ventricular internal cavity size was normal in size. There is mild left ventricular hypertrophy. Left ventricular diastolic parameters were normal.  Right Ventricle: The right ventricular size is normal. No increase in right ventricular wall thickness. Right ventricular systolic function is normal. There is normal pulmonary artery systolic pressure. The tricuspid regurgitant velocity is 2.61 m/s, and with an assumed right atrial pressure of 3 mmHg, the estimated right ventricular systolic pressure is 30.2 mmHg.  Left Atrium: Left atrial size was normal in size.  Right Atrium: Right atrial size was normal in size.  Pericardium: There is no evidence of pericardial effusion.  Mitral [name]: The mitral [name] is grossly normal. Trivial mitral [name] regurgitation. No evidence of mitral [name] stenosis.  Tricuspid [name]: The tricuspid [name] is normal in structure. Tricuspid [name] regurgitation is mild to moderate. No evidence of tricuspid stenosis.  Aortic [name]: The aortic [name] has been repaired/replaced. Aortic [name] regurgitation is moderate to severe. Aortic regurgitation PHT measures 546 msec. Aortic [name] mean gradient measures 13.7 mmHg. Aortic [name] peak gradient measures 24.6 mmHg. Aortic [name] area, by VTI measures 2.04 cm. There is a unknown bioprosthetic [name] present in the aortic position. Procedure Date: 09/21/2009.  Pulmonic [name]: The pulmonic [name] was normal in structure. Pulmonic [name] regurgitation is mild. No evidence of pulmonic stenosis.  Aorta: Aortic dilatation noted. There is moderate dilatation of the ascending aorta. There  is an aneurysm involving the ascending aorta measuring 45 mm.  Venous: The inferior vena cava is normal in size with greater than 50% respiratory variability, suggesting right atrial pressure of 3 mmHg.  IAS/Shunts: No atrial level shunt detected by color flow Doppler.  Additional Comments: 3D was performed not requiring image post processing on an independent workstation and was normal.   LEFT VENTRICLE PLAX 2D LVIDd:         5.10 cm   Diastology LVIDs:         2.10 cm   LV e' medial:    7.62 cm/s LV PW:         1.20 cm   LV E/e' medial:  10.2 LV IVS:  1.00 cm   LV e' lateral:   5.87 cm/s LVOT diam:     2.50 cm   LV E/e' lateral: 13.3 LV SV:         123 LV SV Index:   73 LVOT Area:     4.91 cm  3D Volume EF: 3D EF:        73 % LV EDV:       152 ml LV ESV:       40 ml LV SV:        112 ml  RIGHT VENTRICLE RV Basal diam:  3.90 cm RV S prime:     13.30 cm/s TAPSE (M-mode): 2.7 cm RVSP:           30.2 mmHg  LEFT ATRIUM           Index        RIGHT ATRIUM           Index LA diam:      3.20 cm 1.90 cm/m   RA Pressure: 3.00 mmHg LA Vol (A2C): 55.7 ml 33.04 ml/m  RA Area:     15.30 cm LA Vol (A4C): 35.2 ml 20.88 ml/m  RA Volume:   42.30 ml  25.09 ml/m AORTIC [name] AV Area (Vmax):    1.94 cm AV Area (Vmean):   1.84 cm AV Area (VTI):     2.04 cm AV Vmax:           248.14 cm/s AV Vmean:          172.147 cm/s AV VTI:            0.605 m AV Peak Grad:      24.6 mmHg AV Mean Grad:      13.7 mmHg LVOT Vmax:         98.10 cm/s LVOT Vmean:        64.650 cm/s LVOT VTI:          0.252 m LVOT/AV VTI ratio: 0.42 AI PHT:            546 msec  AORTA Ao Root diam: 3.40 cm Ao Asc diam:  4.50 cm  MITRAL [name]               TRICUSPID [name] MV Area (PHT): cm         TR Peak grad:   27.2 mmHg MV Decel Time: 246 msec    TR Vmax:        261.00 cm/s MV E velocity: 78.10 cm/s  Estimated RAP:  3.00 mmHg MV A velocity: 61.30 cm/s  RVSP:           30.2 mmHg MV E/A ratio:   1.27 SHUNTS Systemic VTI:  0.25 m Systemic Diam: 2.50 cm  Ryan [name] Electronically signed by Ryan [name] Signature Date/Time: 10/07/2023/3:25:56 PM    Final   TEE  ECHO TEE 10/27/2023  Narrative TRANSESOPHOGEAL ECHO REPORT    Patient Name:   RAUNAK ANTUNA Ryan [name] Date of Exam: 10/27/2023 Medical Rec #:  [id number]    Height:       62.0 in Accession #:    [id number]   Weight:       151.2 lb Date of Birth:  24-Mar-1950   BSA:          1.697 m Patient Age:    72 years     BP:           145/64 mmHg Patient Gender: M  HR:           61 bpm. Exam Location:  Inpatient  Procedure: Transesophageal Echo, 3D Echo, Cardiac Doppler and Color Doppler (Both Spectral and Color Flow Doppler were utilized during procedure).  Indications:     Aortic Insufficiency i35.1  History:         Patient has prior history of Echocardiogram examinations, most recent 10/07/2023. COPD; Risk Factors:Hypertension. Aortic [name]: unknown bioprosthetic [name] is present in the aortic position. Procedure Date: 09/21/09.  Sonographer:     Ryan [name] Ryan [name] Referring Phys:  [id number] Ryan [name] Diagnosing Phys: Ryan [name]  PROCEDURE: After discussion of the risks and benefits of a TEE, an informed consent was obtained from the patient. TEE procedure time was 26 minutes. The transesophogeal probe was passed without difficulty through the esophogus of the patient. Imaged were obtained with the patient in a left lateral decubitus position. Sedation performed by different physician. The patient was monitored while under deep sedation. Anesthestetic sedation was provided intravenously by Anesthesiology: 287mg  of Propofol , 60mg  of Lidocaine . Image quality was excellent. The patient's vital signs; including heart rate, blood pressure, and oxygen saturation; remained stable throughout the procedure. The patient developed no complications during the procedure.  IMPRESSIONS   1. Left  ventricular ejection fraction, by estimation, is 60 to 65%. The left ventricle has normal function. The left ventricular internal cavity size was mildly dilated. 2. Right ventricular systolic function is normal. The right ventricular size is mildly enlarged. 3. Left atrial size was mildly dilated. No left atrial/left atrial appendage thrombus was detected. The LAA emptying velocity was 30 cm/s. 4. Right atrial size was moderately dilated. 5. The mitral [name] is grossly normal. Trivial mitral [name] regurgitation. No evidence of mitral stenosis. 6. The aortic [name] has been repaired/replaced. There is moderate calcification of the prosthetic aortic [name] leaflets. Aortic [name] regurgitation is severe. Holodiastolic reversal TVI 22 cm. Vena contracta is 0.5 cm, suggesting moderate severe however the jet enters the LVOT and is eccentric directed toward anterior mitral annulus, suggesting that AI is truly severe. Mild aortic [name] stenosis with high flow from AI. There is a unknown size bioprosthetic [name] present in the aortic position. LVOT diameter 21 mm. Procedure Date: 09/21/09. Aortic [name] area, by VTI measures 1.35 cm. Aortic [name] mean gradient measures 15.0 mmHg. Aortic [name] Vmax measures 2.34 m/s. 7. Aneurysm of the ascending aorta, measuring 45 mm. There is moderate dilatation of the ascending aorta. There is mild (Grade II) atheroma plaque involving the aortic arch. 8. Agitated saline contrast bubble study was negative, with no evidence of any interatrial shunt. 9. 3D performed of the aortic [name] and demonstrates bioprosthetic aortic [name] with thickened calcified leaflets and severe AI.  FINDINGS Left Ventricle: Left ventricular ejection fraction, by estimation, is 60 to 65%. The left ventricle has normal function. The left ventricular internal cavity size was mildly dilated.  Right Ventricle: The right ventricular size is mildly enlarged. No increase in right ventricular wall thickness.  Right ventricular systolic function is normal.  Left Atrium: Left atrial size was mildly dilated. No left atrial/left atrial appendage thrombus was detected. The LAA emptying velocity was 30 cm/s.  Right Atrium: Right atrial size was moderately dilated.  Pericardium: Trivial pericardial effusion is present.  Mitral [name]: The mitral [name] is grossly normal. Trivial mitral [name] regurgitation. No evidence of mitral [name] stenosis.  Tricuspid [name]: The tricuspid [name] is normal in structure. Tricuspid [name] regurgitation is mild.  Aortic [name]: The aortic [name]  has been repaired/replaced. There is moderate calcification of the aortic [name]. Aortic [name] regurgitation is severe. Mild aortic stenosis is present. Aortic [name] mean gradient measures 15.0 mmHg. Aortic [name] peak gradient measures 21.9 mmHg. Aortic [name] area, by VTI measures 1.35 cm. There is a unknown bioprosthetic [name] present in the aortic position. Procedure Date: 09/21/09.  Pulmonic [name]: The pulmonic [name] was normal in structure. Pulmonic [name] regurgitation is trivial.  Aorta: The aortic root is normal in size and structure and the aortic root and ascending aorta are structurally normal, with no evidence of dilitation. There is moderate dilatation of the ascending aorta. There is an aneurysm involving the ascending aorta measuring 45 mm. There is mild (Grade II) atheroma plaque involving the aortic arch.  IAS/Shunts: The interatrial septum appears to be lipomatous. No atrial level shunt detected by color flow Doppler. Agitated saline contrast was given intravenously to evaluate for intracardiac shunting. Agitated saline contrast bubble study was negative, with no evidence of any interatrial shunt.  Additional Comments: 3D was performed not requiring image post processing on an independent workstation and was abnormal. Spectral Doppler performed.  LEFT VENTRICLE PLAX 2D LVOT diam:     2.10 cm LV SV:         77 LV SV  Index:   46 LVOT Area:     3.46 cm   AORTIC [name] AV Area (Vmax):    1.32 cm AV Area (Vmean):   1.25 cm AV Area (VTI):     1.35 cm AV Vmax:           234.00 cm/s AV Vmean:          186.000 cm/s AV VTI:            0.573 m AV Peak Grad:      21.9 mmHg AV Mean Grad:      15.0 mmHg LVOT Vmax:         88.90 cm/s LVOT Vmean:        67.000 cm/s LVOT VTI:          0.223 m LVOT/AV VTI ratio: 0.39  AORTA Ao Root diam: 3.30 cm Ao Asc diam:  4.50 cm   SHUNTS Systemic VTI:  0.22 m Systemic Diam: 2.10 cm  Ryan [name] Electronically signed by Ryan [name] Signature Date/Time: 10/27/2023/5:23:42 PM    Final        ______________________________________________________________________________________________      EKG:   EKG Interpretation Date/Time:  Monday November 24 2023 13:33:50 EDT Ventricular Rate:  52 PR Interval:  196 QRS Duration:  90 QT Interval:  434 QTC Calculation: 403 R Axis:   80  Text Interpretation: Sinus bradycardia When compared with ECG of 23-Oct-2023 08:15, T wave inversion no longer evident in Anterior leads Confirmed by Ryan [name] [phone]) on 11/24/2023 2:20:49 PM    Recent Labs: 10/07/2023: BUN 26; Creatinine, Ser 1.41; Potassium 4.9; Sodium 143 10/23/2023: Hemoglobin 15.3; Platelets 141  Recent Lipid Panel    Component Value Date/Time   CHOL 138 11/28/2017 0917   TRIG 60 11/28/2017 0917   HDL 52 11/28/2017 0917   CHOLHDL 2.7 11/28/2017 0917   LDLCALC 74 11/28/2017 0917     Risk Assessment/Calculations:                Physical Exam:    VS:  BP 110/70   Pulse (!) 52   Ht 5' 2 (1.575 m)   Wt 146 lb 9.6 oz (66.5 kg)   SpO2 96%  BMI 26.81 kg/m     Wt Readings from Last 3 Encounters:  11/24/23 146 lb 9.6 oz (66.5 kg)  10/23/23 151 lb 3.2 oz (68.6 kg)  09/16/23 148 lb 12.8 oz (67.5 kg)     GEN:  Well nourished, well developed in no acute distress HEENT: Normal NECK: No JVD; No carotid bruits LYMPHATICS:  No lymphadenopathy CARDIAC: RRR, 2/6 diastolic decrescendo murmur at the right upper sternal border, 2/6 systolic murmur at the right upper sternal border RESPIRATORY:  Clear to auscultation without rales, wheezing or rhonchi  ABDOMEN: Soft, non-tender, non-distended MUSCULOSKELETAL:  No edema; No deformity  SKIN: Warm and dry NEUROLOGIC:  Alert and oriented x 3 PSYCHIATRIC:  Normal affect   Assessment & Plan Nonrheumatic aortic [name] insufficiency Personally reviewed TEE images and stress test results.  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  He does have mild dyspnea with swimming but had no cardiovascular symptoms with 12 metabolic equivalents on a GXT.  Likely with NYHA functional class I symptoms.  Discussed some of the pros and cons of clinical surveillance, redo [name] surgery, and [name] in [name] TAVR.  The primary issue with [name] in [name] TAVR relates to concerns of TAVR [name] deterioration over time as the patient is only 73 years old and otherwise healthy.  We discussed some of the issues that can come up with surveillance and we do not want him to develop progressive LV dilatation or LV dysfunction.  Will review his case with our multidisciplinary heart [name] team tomorrow.  Will plan to see him back in 6 months with an echo prior to his visit.  Will contact him after tomorrow's meeting if any intervention is recommended sooner.            Medication Adjustments/Labs and Tests Ordered: Current medicines are reviewed at length with the patient today.  Concerns regarding medicines are outlined above.  Orders Placed This Encounter  Procedures   EKG 12-Lead   ECHOCARDIOGRAM COMPLETE   No orders of the defined types were placed in this encounter.   Patient Instructions  Medication Instructions:  No medication changes were made at this visit. Continue current regimen.   *If you need a refill on  your cardiac medications before your next appointment, please call your pharmacy*  Lab Work: None ordered today. If you have labs (blood work) drawn today and your tests are completely normal, you will receive your results only by: MyChart Message (if you have MyChart) OR A paper copy in the mail If you have any lab test that is abnormal or we need to change your treatment, we will call you to review the results.  Testing/Procedures: Your physician has requested that you have an echocardiogram prior to 6 month follow-up appointment. Echocardiography is a painless test that uses sound waves to create images of your heart. It provides your doctor with information about the size and shape of your heart and how well your heart's chambers and valves are working. This procedure takes approximately one hour. There are no restrictions for this procedure. Please do NOT wear cologne, perfume, aftershave, or lotions (deodorant is allowed). Please arrive 15 minutes prior to your appointment time.  Please note: We ask at that you not bring children with you during ultrasound (echo/ vascular) testing. Due to room size and safety concerns, children are not allowed in the ultrasound rooms during exams. Our front office staff cannot provide observation of children in  our lobby area while testing is being conducted. An adult accompanying a patient to their appointment will only be allowed in the ultrasound room at the discretion of the ultrasound technician under special circumstances. We apologize for any inconvenience.   Follow-Up: At [facility], you and your health needs are our priority.  As part of our continuing mission to provide you with exceptional heart care, our [name] are all part of one team.  This team includes your primary Cardiologist (physician) and Advanced Practice [name] or APPs (Physician Assistants and Nurse Practitioners) who all work together to provide you with the care you  need, when you need it.  Your next appointment:   6 month(s)  Provider:   Ozell [name], Ryan [name]      Signed, Ryan [name], Ryan [name]  11/24/2023 5:05 PM    [facility]

## 2023-11-24 NOTE — Patient Instructions (Addendum)
 Medication Instructions:  No medication changes were made at this visit. Continue current regimen.   *If you need a refill on your cardiac medications before your next appointment, please call your pharmacy*  Lab Work: None ordered today. If you have labs (blood work) drawn today and your tests are completely normal, you will receive your results only by: MyChart Message (if you have MyChart) OR A paper copy in the mail If you have any lab test that is abnormal or we need to change your treatment, we will call you to review the results.  Testing/Procedures: Your physician has requested that you have an echocardiogram prior to 6 month follow-up appointment. Echocardiography is a painless test that uses sound waves to create images of your heart. It provides your doctor with information about the size and shape of your heart and how well your heart's chambers and valves are working. This procedure takes approximately one hour. There are no restrictions for this procedure. Please do NOT wear cologne, perfume, aftershave, or lotions (deodorant is allowed). Please arrive 15 minutes prior to your appointment time.  Please note: We ask at that you not bring children with you during ultrasound (echo/ vascular) testing. Due to room size and safety concerns, children are not allowed in the ultrasound rooms during exams. Our front office staff cannot provide observation of children in our lobby area while testing is being conducted. An adult accompanying a patient to their appointment will only be allowed in the ultrasound room at the discretion of the ultrasound technician under special circumstances. We apologize for any inconvenience.   Follow-Up: At [facility], you and your health needs are our priority.  As part of our continuing mission to provide you with exceptional heart care, our providers are all part of one team.  This team includes your primary Cardiologist (physician) and Advanced  Practice Providers or APPs (Physician Assistants and Nurse Practitioners) who all work together to provide you with the care you need, when you need it.  Your next appointment:   6 month(s)  Provider:   Ozell Fell, MD

## 2023-11-24 NOTE — Assessment & Plan Note (Signed)
 Personally reviewed TEE images and stress test results.  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  He does have mild dyspnea with swimming but had no cardiovascular symptoms with 12 metabolic equivalents on a GXT.  Likely with NYHA functional class I symptoms.  Discussed some of the pros and cons of clinical surveillance, redo valve surgery, and valve in valve TAVR.  The primary issue with valve in valve TAVR relates to concerns of TAVR valve deterioration over time as the patient is only 73 years old and otherwise healthy.  We discussed some of the issues that can come up with surveillance and we do not want him to develop progressive LV dilatation or LV dysfunction.  Will review his case with our multidisciplinary heart valve team tomorrow.  Will plan to see him back in 6 months with an echo prior to his visit.  Will contact him after tomorrow's meeting if any intervention is recommended sooner.

## 2024-01-13 DIAGNOSIS — E785 Hyperlipidemia, unspecified: Secondary | ICD-10-CM | POA: Diagnosis not present

## 2024-05-25 ENCOUNTER — Ambulatory Visit (HOSPITAL_COMMUNITY)

## 2024-05-28 ENCOUNTER — Ambulatory Visit: Admitting: Cardiovascular Disease
